# Patient Record
Sex: Female | Born: 1995 | Race: White | Hispanic: No | Marital: Single | Smoking: Former smoker
Health system: Southern US, Community
[De-identification: ages and names within clinical notes are randomized; demographics above are authoritative.]

## PROBLEM LIST (undated history)

## (undated) DIAGNOSIS — Z789 Other specified health status: Secondary | ICD-10-CM

## (undated) DIAGNOSIS — G43909 Migraine, unspecified, not intractable, without status migrainosus: Secondary | ICD-10-CM

## (undated) DIAGNOSIS — F509 Eating disorder, unspecified: Secondary | ICD-10-CM

## (undated) DIAGNOSIS — S82871A Displaced pilon fracture of right tibia, initial encounter for closed fracture: Secondary | ICD-10-CM

## (undated) DIAGNOSIS — N926 Irregular menstruation, unspecified: Secondary | ICD-10-CM

## (undated) DIAGNOSIS — S82891A Other fracture of right lower leg, initial encounter for closed fracture: Secondary | ICD-10-CM

## (undated) DIAGNOSIS — Z8489 Family history of other specified conditions: Secondary | ICD-10-CM

## (undated) DIAGNOSIS — R630 Anorexia: Secondary | ICD-10-CM

## (undated) HISTORY — DX: Eating disorder, unspecified: F50.9

---
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
## 2013-01-14 ENCOUNTER — Ambulatory Visit (HOSPITAL_COMMUNITY): Payer: Self-pay | Admitting: Psychiatry

## 2013-02-21 ENCOUNTER — Ambulatory Visit (HOSPITAL_COMMUNITY): Payer: Self-pay | Admitting: Psychiatry

## 2014-04-21 ENCOUNTER — Encounter (HOSPITAL_COMMUNITY): Payer: Self-pay | Admitting: Emergency Medicine

## 2014-04-21 ENCOUNTER — Emergency Department (HOSPITAL_COMMUNITY)
Admission: EM | Admit: 2014-04-21 | Discharge: 2014-04-21 | Disposition: A | Discharge status: Home / Self Care | Payer: Self-pay | Attending: Emergency Medicine | Admitting: Emergency Medicine

## 2014-04-21 ENCOUNTER — Emergency Department (HOSPITAL_COMMUNITY): Payer: Self-pay

## 2014-04-21 DIAGNOSIS — Y92009 Unspecified place in unspecified non-institutional (private) residence as the place of occurrence of the external cause: Secondary | ICD-10-CM | POA: Insufficient documentation

## 2014-04-21 DIAGNOSIS — R52 Pain, unspecified: Secondary | ICD-10-CM

## 2014-04-21 DIAGNOSIS — S82891A Other fracture of right lower leg, initial encounter for closed fracture: Secondary | ICD-10-CM

## 2014-04-21 DIAGNOSIS — X58XXXA Exposure to other specified factors, initial encounter: Secondary | ICD-10-CM | POA: Insufficient documentation

## 2014-04-21 DIAGNOSIS — Y998 Other external cause status: Secondary | ICD-10-CM | POA: Insufficient documentation

## 2014-04-21 DIAGNOSIS — Y9339 Activity, other involving climbing, rappelling and jumping off: Secondary | ICD-10-CM | POA: Insufficient documentation

## 2014-04-21 DIAGNOSIS — F909 Attention-deficit hyperactivity disorder, unspecified type: Secondary | ICD-10-CM | POA: Insufficient documentation

## 2014-04-21 DIAGNOSIS — Z8701 Personal history of pneumonia (recurrent): Secondary | ICD-10-CM | POA: Insufficient documentation

## 2014-04-21 DIAGNOSIS — Z880 Allergy status to penicillin: Secondary | ICD-10-CM | POA: Insufficient documentation

## 2014-04-21 DIAGNOSIS — R609 Edema, unspecified: Secondary | ICD-10-CM

## 2014-04-21 DIAGNOSIS — S82841A Displaced bimalleolar fracture of right lower leg, initial encounter for closed fracture: Secondary | ICD-10-CM | POA: Insufficient documentation

## 2014-04-21 DIAGNOSIS — Z79899 Other long term (current) drug therapy: Secondary | ICD-10-CM | POA: Insufficient documentation

## 2014-04-21 HISTORY — DX: Other fracture of right lower leg, initial encounter for closed fracture: S82.891A

## 2014-04-21 MED ORDER — OXYCODONE-ACETAMINOPHEN 5-325 MG PO TABS
2.0000 | ORAL_TABLET | Freq: Once | ORAL | Status: AC
  Administered 2014-04-21: 2 via ORAL
  Filled 2014-04-21: qty 2

## 2014-04-21 MED ORDER — OXYCODONE-ACETAMINOPHEN 10-325 MG PO TABS: 1.0000 | ORAL_TABLET | ORAL | Status: DC | PRN

## 2014-04-21 NOTE — ED Notes (Signed)
Placed ice on right lower ankle

## 2014-04-21 NOTE — ED Notes (Signed)
Pt comfortable with discharge and follow up instructions. Pt declines wheelchair, escorted to waiting area by this RN. Prescription x1 

## 2014-04-21 NOTE — Discharge Instructions (Signed)
1. Medications: percocet, usual home medications 2. Treatment: rest, drink plenty of fluids, use crutches - no walking on your foot 3. Follow Up: Please followup with Dr. Dion SaucierLandau this week for further evaluation; Please return to the ER for worsening symptoms, SOB, chest pain or other concerning symptoms

## 2014-04-21 NOTE — Progress Notes (Signed)
Orthopedic Tech Progress Note Patient Details:  Leslie Villegas April 23, 1995 [id number]  Ortho Devices Type of Ortho Device: Ace wrap, Crutches, Post (short leg) splint, Stirrup splint Ortho Device/Splint Location: rle Ortho Device/Splint Interventions: Application   [name] 04/21/2014, 1:42 PM

## 2014-04-21 NOTE — ED Provider Notes (Signed)
CSN: [id number]     Arrival date & time 04/21/14  1059 History   First MD Initiated Contact with Patient 04/21/14 1103     Chief Complaint  Patient presents with  . Ankle Pain     (Consider location/radiation/quality/duration/timing/severity/associated sxs/prior Treatment) Patient is a 19 y.o. female presenting with ankle pain. The history is provided by the patient and medical records. No language interpreter was used.  Ankle Pain Associated symptoms: no back pain, no fever and no neck pain      Leslie Villegas is a 19 y.o. female  with a hx of ADHD, ODD presents to the Emergency Department complaining of acute, persistent right ankle pain with associated swelling after jumping from a first story window and landing on her right foot at 6 AM.  Patient reports she had to jump out of the window because she "wasn't where she was supposed to be."  She denies falling or hitting her head. Patient denies knee, hip, pelvic or low back pain. She reports that she has been unable to ambulate but has full range of motion of her right hip and knee. No pain in the left ankle or leg. No treatments prior to arrival. Nothing makes her symptoms better and attending to weight-bear makes her symptoms worse. She denies fever or chills, nausea or vomiting, headache, neck pain, back pain, chest pain, weakness, numbness, tingling.   Past Medical History  Diagnosis Date  . Mental disorder   . Pneumonia     twice in the past  . ADHD (attention deficit hyperactivity disorder)   . Anxiety   . Oppositional defiant disorder   . Eating disorder    Past Surgical History  Procedure Laterality Date  . Tonsillectomy     History reviewed. No pertinent family history. History  Substance Use Topics  . Smoking status: Never Smoker   . Smokeless tobacco: Never Used  . Alcohol Use: No   OB History    No data available     Review of Systems  Constitutional: Negative for fever and chills.  HENT: Negative for  dental problem, facial swelling and nosebleeds.   Eyes: Negative for visual disturbance.  Respiratory: Negative for cough, chest tightness, shortness of breath, wheezing and stridor.   Cardiovascular: Negative for chest pain.  Gastrointestinal: Negative for nausea, vomiting and abdominal pain.  Genitourinary: Negative for dysuria, hematuria and flank pain.  Musculoskeletal: Positive for arthralgias (right ankle ). Negative for back pain, joint swelling, gait problem, neck pain and neck stiffness.  Skin: Negative for rash and wound.  Neurological: Negative for syncope, weakness, light-headedness, numbness and headaches.  Hematological: Does not bruise/bleed easily.  Psychiatric/Behavioral: The patient is not nervous/anxious.   All other systems reviewed and are negative.     Allergies  Penicillins  Home Medications   Prior to Admission medications   Medication Sig Start Date End Date Taking? Authorizing Provider  etonogestrel (NEXPLANON) 68 MG IMPL implant 1 each by Subdermal route once.   Yes Historical Provider, MD  amphetamine-dextroamphetamine (ADDERALL XR) 20 MG 24 hr capsule Take 1 capsule (20 mg total) by mouth every morning. Patient not taking: Reported on 04/21/2014 12/11/12   [name], MD  oxyCODONE-acetaminophen (PERCOCET) 10-325 MG per tablet Take 1 tablet by mouth every 4 (four) hours as needed for pain. 04/21/14   Sirus Labrie, PA-C   BP 113/72 mmHg  Pulse 73  Temp(Src) 97.7 F (36.5 C) (Oral)  Resp 16  Ht  (1.651 m)  Wt 112 lb (  50.803 kg)  BMI 18.64 kg/m2  SpO2 99%  LMP 04/07/2014 Physical Exam  Constitutional: She is oriented to person, place, and time. She appears well-developed and well-nourished. No distress.  HENT:  Head: Normocephalic and atraumatic.  Nose: Nose normal.  Mouth/Throat: Uvula is midline, oropharynx is clear and moist and mucous membranes are normal.  Eyes: Conjunctivae and EOM are normal. Pupils are equal, round, and  reactive to light.  Neck: Normal range of motion. No spinous process tenderness and no muscular tenderness present. No rigidity. Normal range of motion present.  Full ROM without pain No midline cervical tenderness No paraspinal tenderness  Cardiovascular: Normal rate, regular rhythm, normal heart sounds and intact distal pulses.   No murmur heard. Pulses:      Radial pulses are 2+ on the right side, and 2+ on the left side.       Dorsalis pedis pulses are 2+ on the right side, and 2+ on the left side.       Posterior tibial pulses are 2+ on the right side, and 2+ on the left side.  Capillary refill < 3 sec  Pulmonary/Chest: Effort normal and breath sounds normal. No accessory muscle usage. No respiratory distress. She has no decreased breath sounds. She has no wheezes. She has no rhonchi. She has no rales. She exhibits no tenderness and no bony tenderness.  No contusion or ecchymosis No flail segment, crepitus or deformity Equal chest expansion  Abdominal: Soft. Normal appearance and bowel sounds are normal. There is no tenderness. There is no rigidity, no guarding and no CVA tenderness.  No contusion or ecchymosis  Abd soft and nontender  Musculoskeletal: Normal range of motion. She exhibits tenderness. She exhibits no edema.       Thoracic back: She exhibits normal range of motion.       Lumbar back: She exhibits normal range of motion.  Full range of motion of the T-spine and L-spine No tenderness to palpation of the spinous processes of the T-spine or L-spine No tenderness to palpation of the paraspinous muscles of the L-spine Pelvis stable Full range of motion of the right hip right knee and all toes on the right foot. No range of motion in the right ankle with associated deformity and swelling Palpable pulses in the right foot Capillary refill less than 3 seconds in the right foot  Lymphadenopathy:    She has no cervical adenopathy.  Neurological: She is alert and oriented to  person, place, and time. She has normal reflexes. No cranial nerve deficit. Coordination normal. GCS eye subscore is 4. GCS verbal subscore is 5. GCS motor subscore is 6.  Reflex Scores:      Bicep reflexes are 2+ on the right side and 2+ on the left side.      Brachioradialis reflexes are 2+ on the right side and 2+ on the left side.      Patellar reflexes are 2+ on the right side and 2+ on the left side.      Achilles reflexes are 2+ on the right side and 2+ on the left side. Speech is clear and goal oriented, follows commands Normal 5/5 strength in upper and lower extremities bilaterally excluding dorsiflexion and plantar flexion of the right ankle which is 0/5 Sensation normal to light and sharp touch Moves extremities without ataxia, coordination intact Unable to ambulate due to ankle pain No Clonus  Skin: Skin is warm and dry. No rash noted. She is not diaphoretic. No erythema.  No tenting of the skin  Psychiatric: She has a normal mood and affect.  Nursing note and vitals reviewed.   ED Course  Procedures (including critical care time) Labs Review Labs Reviewed - No data to display  Imaging Review Dg Ankle Complete Right  04/21/2014   CLINICAL DATA:  Leslie Villegas data window. Landed on the right ankle. Pain all over.  EXAM: RIGHT ANKLE - COMPLETE 3+ VIEW  COMPARISON:  None.  FINDINGS: Minimally displaced transverse lateral malleolar fracture with 2 mm of lateral displacement. There is a mildly comminuted medial malleolar fracture without significant displacement. The tibiotalar articulation is normal. There is mild lateral talofibular joint widening. There is no dislocation. There is no other fracture. There is severe soft tissue swelling overlying the lateral malleolus. There is mild soft tissue swelling over the medial malleolus.  IMPRESSION: 1. Bimalleolar right ankle fracture without dislocation.   Electronically Signed   By: Elige Ko   On: 04/21/2014 11:51     EKG  Interpretation None      MDM   Final diagnoses:  Pain  Swelling  Bimalleolar ankle fracture, right, closed, initial encounter   Leslie Villegas presents with right ankle pain and deformity, suspect fracture. Will image and give pain control.  12:24 PM X-ray with bimalleolar right ankle fracture without dislocation.  12:34 PM Discussed with Dr. Dion Saucier who requests splint and outpatient follow-up.  Pt pain controlled.  Pulses intact.  Will splint.  1:48 PM Pulses, movement and capillary refill intact pre and post splinting.    I have personally reviewed patient's vitals, nursing note and any pertinent labs or imaging.  I performed an undressed physical exam.    It has been determined that no acute conditions requiring further emergency intervention are present at this time. The patient/guardian have been advised of the diagnosis and plan. I reviewed all labs and imaging including any potential incidental findings. We have discussed signs and symptoms that warrant return to the ED and they are listed in the discharge instructions.    Vital signs are stable at discharge.   BP 113/72 mmHg  Pulse 73  Temp(Src) 97.7 F (36.5 C) (Oral)  Resp 16  Ht  (1.651 m)  Wt 112 lb (50.803 kg)  BMI 18.64 kg/m2  SpO2 99%  LMP 04/07/2014        Dierdre Forth, PA-C 04/21/14 1348  Elwin Mocha, MD 04/21/14 1515

## 2014-04-21 NOTE — ED Notes (Addendum)
Ortho at bedside to apply splint.

## 2014-04-21 NOTE — ED Notes (Signed)
Pt states she jumped out of her window in the house. She believes it was about 1 and a half stories high. She landed on her right ankle. Pt states she cannot put weight on this ankle.

## 2014-04-28 ENCOUNTER — Encounter (HOSPITAL_BASED_OUTPATIENT_CLINIC_OR_DEPARTMENT_OTHER): Payer: Self-pay | Admitting: *Deleted

## 2014-04-30 ENCOUNTER — Ambulatory Visit (HOSPITAL_BASED_OUTPATIENT_CLINIC_OR_DEPARTMENT_OTHER): Payer: Self-pay | Admitting: Certified Registered"

## 2014-04-30 ENCOUNTER — Encounter (HOSPITAL_BASED_OUTPATIENT_CLINIC_OR_DEPARTMENT_OTHER)
Admission: RE | Disposition: A | Discharge status: Home / Self Care | Payer: Self-pay | Source: Ambulatory Visit | Attending: Orthopedic Surgery

## 2014-04-30 ENCOUNTER — Ambulatory Visit (HOSPITAL_BASED_OUTPATIENT_CLINIC_OR_DEPARTMENT_OTHER)
Admission: RE | Admit: 2014-04-30 | Discharge: 2014-04-30 | Disposition: A | Discharge status: Home / Self Care | Payer: Self-pay | Source: Ambulatory Visit | Attending: Orthopedic Surgery | Admitting: Orthopedic Surgery

## 2014-04-30 ENCOUNTER — Encounter (HOSPITAL_BASED_OUTPATIENT_CLINIC_OR_DEPARTMENT_OTHER): Payer: Self-pay | Admitting: *Deleted

## 2014-04-30 DIAGNOSIS — Z791 Long term (current) use of non-steroidal anti-inflammatories (NSAID): Secondary | ICD-10-CM | POA: Insufficient documentation

## 2014-04-30 DIAGNOSIS — Z79899 Other long term (current) drug therapy: Secondary | ICD-10-CM | POA: Insufficient documentation

## 2014-04-30 DIAGNOSIS — Y929 Unspecified place or not applicable: Secondary | ICD-10-CM | POA: Insufficient documentation

## 2014-04-30 DIAGNOSIS — F17210 Nicotine dependence, cigarettes, uncomplicated: Secondary | ICD-10-CM | POA: Insufficient documentation

## 2014-04-30 DIAGNOSIS — X58XXXA Exposure to other specified factors, initial encounter: Secondary | ICD-10-CM | POA: Insufficient documentation

## 2014-04-30 DIAGNOSIS — G43909 Migraine, unspecified, not intractable, without status migrainosus: Secondary | ICD-10-CM | POA: Insufficient documentation

## 2014-04-30 DIAGNOSIS — Y9339 Activity, other involving climbing, rappelling and jumping off: Secondary | ICD-10-CM | POA: Insufficient documentation

## 2014-04-30 DIAGNOSIS — Y999 Unspecified external cause status: Secondary | ICD-10-CM | POA: Insufficient documentation

## 2014-04-30 DIAGNOSIS — S82871A Displaced pilon fracture of right tibia, initial encounter for closed fracture: Secondary | ICD-10-CM

## 2014-04-30 HISTORY — DX: Anorexia: R63.0

## 2014-04-30 HISTORY — PX: ORIF ANKLE FRACTURE: SHX5408

## 2014-04-30 HISTORY — DX: Displaced pilon fracture of right tibia, initial encounter for closed fracture: S82.871A

## 2014-04-30 HISTORY — DX: Other specified health status: Z78.9

## 2014-04-30 HISTORY — DX: Migraine, unspecified, not intractable, without status migrainosus: G43.909

## 2014-04-30 HISTORY — DX: Irregular menstruation, unspecified: N92.6

## 2014-04-30 HISTORY — DX: Family history of other specified conditions: Z84.89

## 2014-04-30 HISTORY — DX: Other fracture of right lower leg, initial encounter for closed fracture: S82.891A

## 2014-04-30 SURGERY — OPEN REDUCTION INTERNAL FIXATION (ORIF) ANKLE FRACTURE
Anesthesia: Regional | Site: Ankle | Laterality: Right

## 2014-04-30 MED ORDER — MEPERIDINE HCL 25 MG/ML IJ SOLN: 6.2500 mg | INTRAMUSCULAR | Status: DC | PRN

## 2014-04-30 MED ORDER — BACLOFEN 10 MG PO TABS: 10.0000 mg | ORAL_TABLET | Freq: Three times a day (TID) | ORAL | Status: DC

## 2014-04-30 MED ORDER — MIDAZOLAM HCL 2 MG/2ML IJ SOLN
INTRAMUSCULAR | Status: AC
  Filled 2014-04-30: qty 2

## 2014-04-30 MED ORDER — FENTANYL CITRATE 0.05 MG/ML IJ SOLN
50.0000 ug | INTRAMUSCULAR | Status: DC | PRN
  Administered 2014-04-30 (×2): 50 ug via INTRAVENOUS

## 2014-04-30 MED ORDER — HYDROMORPHONE HCL 1 MG/ML IJ SOLN
INTRAMUSCULAR | Status: AC
  Filled 2014-04-30: qty 1

## 2014-04-30 MED ORDER — SENNA-DOCUSATE SODIUM 8.6-50 MG PO TABS: 2.0000 | ORAL_TABLET | Freq: Every day | ORAL | Status: DC

## 2014-04-30 MED ORDER — LIDOCAINE HCL (CARDIAC) 20 MG/ML IV SOLN
INTRAVENOUS | Status: DC | PRN
  Administered 2014-04-30: 60 mg via INTRAVENOUS

## 2014-04-30 MED ORDER — OXYCODONE-ACETAMINOPHEN 10-325 MG PO TABS: 1.0000 | ORAL_TABLET | Freq: Four times a day (QID) | ORAL | Status: DC | PRN

## 2014-04-30 MED ORDER — OXYCODONE-ACETAMINOPHEN 5-325 MG PO TABS: 1.0000 | ORAL_TABLET | Freq: Four times a day (QID) | ORAL | Status: DC | PRN

## 2014-04-30 MED ORDER — MIDAZOLAM HCL 2 MG/ML PO SYRP: 12.0000 mg | ORAL_SOLUTION | Freq: Once | ORAL | Status: DC | PRN

## 2014-04-30 MED ORDER — CEFAZOLIN SODIUM-DEXTROSE 2-3 GM-% IV SOLR
INTRAVENOUS | Status: AC
  Filled 2014-04-30: qty 50

## 2014-04-30 MED ORDER — ONDANSETRON HCL 4 MG/2ML IJ SOLN
INTRAMUSCULAR | Status: DC | PRN
  Administered 2014-04-30: 4 mg via INTRAVENOUS

## 2014-04-30 MED ORDER — MIDAZOLAM HCL 2 MG/2ML IJ SOLN
1.0000 mg | INTRAMUSCULAR | Status: DC | PRN
  Administered 2014-04-30: 2 mg via INTRAVENOUS

## 2014-04-30 MED ORDER — FENTANYL CITRATE 0.05 MG/ML IJ SOLN
INTRAMUSCULAR | Status: AC
  Filled 2014-04-30: qty 6

## 2014-04-30 MED ORDER — HYDROMORPHONE HCL 1 MG/ML IJ SOLN
0.2500 mg | INTRAMUSCULAR | Status: DC | PRN
  Administered 2014-04-30: 0.25 mg via INTRAVENOUS

## 2014-04-30 MED ORDER — PROPOFOL 10 MG/ML IV BOLUS
INTRAVENOUS | Status: DC | PRN
  Administered 2014-04-30: 130 mg via INTRAVENOUS

## 2014-04-30 MED ORDER — PROMETHAZINE HCL 25 MG/ML IJ SOLN: 6.2500 mg | INTRAMUSCULAR | Status: DC | PRN

## 2014-04-30 MED ORDER — CEFAZOLIN SODIUM-DEXTROSE 2-3 GM-% IV SOLR
2.0000 g | INTRAVENOUS | Status: AC
  Administered 2014-04-30: 2 g via INTRAVENOUS

## 2014-04-30 MED ORDER — FENTANYL CITRATE 0.05 MG/ML IJ SOLN
INTRAMUSCULAR | Status: AC
  Filled 2014-04-30: qty 2

## 2014-04-30 MED ORDER — ONDANSETRON HCL 4 MG PO TABS: 4.0000 mg | ORAL_TABLET | Freq: Three times a day (TID) | ORAL | Status: DC | PRN

## 2014-04-30 MED ORDER — LACTATED RINGERS IV SOLN
INTRAVENOUS | Status: DC
  Administered 2014-04-30 (×2): via INTRAVENOUS

## 2014-04-30 MED ORDER — DEXAMETHASONE SODIUM PHOSPHATE 4 MG/ML IJ SOLN
INTRAMUSCULAR | Status: DC | PRN
  Administered 2014-04-30: 10 mg via INTRAVENOUS

## 2014-04-30 SURGICAL SUPPLY — 80 items
BANDAGE ELASTIC 4 VELCRO ST LF (GAUZE/BANDAGES/DRESSINGS) ×3 IMPLANT
BANDAGE ELASTIC 6 VELCRO ST LF (GAUZE/BANDAGES/DRESSINGS) ×3 IMPLANT
BANDAGE ESMARK 6X9 LF (GAUZE/BANDAGES/DRESSINGS) ×1 IMPLANT
BIT DRILL 2.5X110 QC LCP DISP (BIT) ×3 IMPLANT
BIT DRILL CANN 2.7X625 NONSTRL (BIT) ×3 IMPLANT
BLADE SURG 15 STRL LF DISP TIS (BLADE) ×2 IMPLANT
BLADE SURG 15 STRL SS (BLADE) ×4
BNDG COHESIVE 4X5 TAN STRL (GAUZE/BANDAGES/DRESSINGS) ×3 IMPLANT
BNDG ESMARK 6X9 LF (GAUZE/BANDAGES/DRESSINGS) ×3
CANISTER SUCT 1200ML W/VALVE (MISCELLANEOUS) ×3 IMPLANT
CLOSURE STERI-STRIP 1/2X4 (GAUZE/BANDAGES/DRESSINGS)
CLSR STERI-STRIP ANTIMIC 1/2X4 (GAUZE/BANDAGES/DRESSINGS) IMPLANT
COVER BACK TABLE 60X90IN (DRAPES) ×3 IMPLANT
CUFF TOURNIQUET SINGLE 34IN LL (TOURNIQUET CUFF) ×3 IMPLANT
DECANTER SPIKE VIAL GLASS SM (MISCELLANEOUS) IMPLANT
DRAPE C-ARM 42X72 X-RAY (DRAPES) IMPLANT
DRAPE C-ARMOR (DRAPES) IMPLANT
DRAPE EXTREMITY T 121X128X90 (DRAPE) ×3 IMPLANT
DRAPE INCISE IOBAN 66X45 STRL (DRAPES) IMPLANT
DRAPE OEC MINIVIEW 54X84 (DRAPES) ×3 IMPLANT
DRAPE U 20/CS (DRAPES) ×3 IMPLANT
DRAPE U-SHAPE 47X51 STRL (DRAPES) ×3 IMPLANT
DRSG PAD ABDOMINAL 8X10 ST (GAUZE/BANDAGES/DRESSINGS) ×3 IMPLANT
DURAPREP 26ML APPLICATOR (WOUND CARE) ×3 IMPLANT
ELECT REM PT RETURN 9FT ADLT (ELECTROSURGICAL) ×3
ELECTRODE REM PT RTRN 9FT ADLT (ELECTROSURGICAL) ×1 IMPLANT
GAUZE SPONGE 4X4 12PLY STRL (GAUZE/BANDAGES/DRESSINGS) ×3 IMPLANT
GLOVE BIO SURGEON STRL SZ8 (GLOVE) ×3 IMPLANT
GLOVE BIOGEL PI IND STRL 7.0 (GLOVE) ×2 IMPLANT
GLOVE BIOGEL PI IND STRL 8 (GLOVE) ×2 IMPLANT
GLOVE BIOGEL PI INDICATOR 7.0 (GLOVE) ×4
GLOVE BIOGEL PI INDICATOR 8 (GLOVE) ×4
GLOVE ECLIPSE 6.5 STRL STRAW (GLOVE) ×3 IMPLANT
GLOVE ORTHO TXT STRL SZ7.5 (GLOVE) ×3 IMPLANT
GOWN STRL REUS W/ TWL LRG LVL3 (GOWN DISPOSABLE) ×1 IMPLANT
GOWN STRL REUS W/ TWL XL LVL3 (GOWN DISPOSABLE) ×2 IMPLANT
GOWN STRL REUS W/TWL LRG LVL3 (GOWN DISPOSABLE) ×2
GOWN STRL REUS W/TWL XL LVL3 (GOWN DISPOSABLE) ×4
KIT 1/3 TUB PL 3H 37M (Orthopedic Implant) ×1 IMPLANT
KIT 1/3 TUB PL 5H 61M (Orthopedic Implant) ×1 IMPLANT
NEEDLE HYPO 25X1 1.5 SAFETY (NEEDLE) IMPLANT
NS IRRIG 1000ML POUR BTL (IV SOLUTION) ×3 IMPLANT
PACK BASIN DAY SURGERY FS (CUSTOM PROCEDURE TRAY) ×3 IMPLANT
PAD CAST 4YDX4 CTTN HI CHSV (CAST SUPPLIES) ×2 IMPLANT
PADDING CAST COTTON 4X4 STRL (CAST SUPPLIES) ×4
PENCIL BUTTON HOLSTER BLD 10FT (ELECTRODE) ×3 IMPLANT
PROS 1/3 TUB PL 3H 37M (Orthopedic Implant) ×3 IMPLANT
PROS 1/3 TUB PL 5H 61M (Orthopedic Implant) ×3 IMPLANT
SCREW CANC FT 4.0X35 (Screw) ×3 IMPLANT
SCREW CANC FT ST SFS 4X14 (Screw) ×3 IMPLANT
SCREW CANC FT ST SFS 4X16 (Screw) ×3 IMPLANT
SCREW CANN S THRD/50 4.0 (Screw) ×3 IMPLANT
SCREW CORTEX 3.5 12MM (Screw) ×2 IMPLANT
SCREW CORTEX 3.5 14MM (Screw) ×4 IMPLANT
SCREW CORTEX 3.5 28MM (Screw) ×2 IMPLANT
SCREW LOCK CORT ST 3.5X12 (Screw) ×1 IMPLANT
SCREW LOCK CORT ST 3.5X14 (Screw) ×2 IMPLANT
SCREW LOCK CORT ST 3.5X28 (Screw) ×1 IMPLANT
SHEET MEDIUM DRAPE 40X70 STRL (DRAPES) IMPLANT
SLEEVE SCD COMPRESS KNEE MED (MISCELLANEOUS) IMPLANT
SPLINT FAST PLASTER 5X30 (CAST SUPPLIES)
SPLINT PLASTER CAST FAST 5X30 (CAST SUPPLIES) IMPLANT
SPONGE LAP 4X18 X RAY DECT (DISPOSABLE) ×3 IMPLANT
STAPLER VISISTAT 35W (STAPLE) IMPLANT
SUCTION FRAZIER TIP 10 FR DISP (SUCTIONS) ×3 IMPLANT
SUT ETHILON 3 0 PS 1 (SUTURE) IMPLANT
SUT ETHILON 4 0 PS 2 18 (SUTURE) IMPLANT
SUT MNCRL AB 4-0 PS2 18 (SUTURE) IMPLANT
SUT VIC AB 0 CT1 27 (SUTURE)
SUT VIC AB 0 CT1 27XBRD ANBCTR (SUTURE) IMPLANT
SUT VIC AB 2-0 SH 18 (SUTURE) IMPLANT
SUT VIC AB 3-0 SH 27 (SUTURE) ×2
SUT VIC AB 3-0 SH 27X BRD (SUTURE) ×1 IMPLANT
SUT VICRYL 3-0 CR8 SH (SUTURE) ×3 IMPLANT
SYR BULB 3OZ (MISCELLANEOUS) ×3 IMPLANT
SYR CONTROL 10ML LL (SYRINGE) IMPLANT
TUBE CONNECTING 20'X1/4 (TUBING) ×1
TUBE CONNECTING 20X1/4 (TUBING) ×2 IMPLANT
UNDERPAD 30X30 INCONTINENT (UNDERPADS AND DIAPERS) ×3 IMPLANT
YANKAUER SUCT BULB TIP NO VENT (SUCTIONS) ×3 IMPLANT

## 2014-04-30 NOTE — Op Note (Signed)
04/30/2014  PATIENT:  Leslie Villegas    PRE-OPERATIVE DIAGNOSIS:  Right pilon fracture  POST-OPERATIVE DIAGNOSIS:  Same  PROCEDURE:  OPEN REDUCTION INTERNAL FIXATION (ORIF) RIGHT PILON FRACTURE WITH INTERNAL FIXATION OF MEDIAL AND LATERAL MALLEOLUS  SURGEON:  Eulas PostLANDAU,Alphonza Tramell P, MD  PHYSICIAN ASSISTANT: Janace LittenBrandon Parry, OPA-C, present and scrubbed throughout the case, critical for completion in a timely fashion, and for retraction, instrumentation, and closure.  ANESTHESIA:   General  PREOPERATIVE INDICATIONS:  Leslie Villegas is a  19 y.o. female who jumped off of a roof trying to sneak out from home and had a right pilon fracture. She elected for surgical management.  The risks benefits and alternatives were discussed with the patient preoperatively including but not limited to the risks of infection, bleeding, nerve injury, cardiopulmonary complications, the need for revision surgery, the need for hardware removal, among others, and the patient was willing to proceed.  OPERATIVE IMPLANTS: 1/3 tubular plate, size 5 hole for the lateral side, size 3 hole for the medial side, with a single 4.0 mm cannulated screw for the medial malleolus.  OPERATIVE PROCEDURE: The patient was brought to the operating room and placed in the supine position. All bony prominences were padded. General anesthesia was administered. The lower extremity was prepped and draped in the usual sterile fashion. The leg was elevated and exsanguinated and the tourniquet was inflated. Time out was performed. Of note, there was an area over the heel that appeared to have some degree of pressure from the splint, although it was not full-thickness skin damage, but certainly some degree of pressure damage to the skin.  Incision was made over the distal fibula and the fracture was exposed and reduced anatomically, and this is an extremely low fracture pattern, and in fact I could barely hold it with a clamp. It was not amenable to a  lag screw.  I then applied a 1/3 tubular locking plate and secured it proximally with cortical screws and distally with 2 non-locking cancellus screws. Bone quality was good. I used c-arm to confirm satisfactory reduction and fixation. The lateral fracture was however fairly comminuted, and did have interposed periosteum.  I then turned my attention to the medial malleolus. Incision was made over the medial malleolus and the fracture exposed. This is extremely comminuted, and in a vertical shear type pattern. I mobilized the fracture pattern, exposed and removed all interposed tissue and cortical debris, as there was a fair amount of comminution.  I reduced the fracture is anatomically as possible, restoring length and position of the medial fragment, and then placed a single cannulated guidewire to provisionally hold the fixation.  I then selected a one third tubular anti-glide plate, in order to counteract the vertical shear pattern, secured it proximally with a bicortical screw, and then distally across the fracture site with a cancellus screw. The length of the medial malleolus as well as his overall alignment was restored. She had some articular comminution, which was not amenable to fixation over the anteromedial corner. All loose debris was removed.  I then placed a 50 mm cannulated screw, short threads, which provided an augmentation for fixation of the medial malleolus.  The syndesmosis was stressed using live fluoroscopy and found to be stable. I performed both the external rotation dorsiflexion test, as well as distraction of the syndesmosis under live fluoroscopy using a clamp, and it appeared stable. Given that the fibular fracture pattern exited below the syndesmosis, I felt that the syndesmosis was intact based on the  combination of the intraoperative findings and the fracture pattern of a vertical shear transverse distal fibula.  The wounds were irrigated, and closed with vicryl with  routine closure for the skin. The wounds were injected with local anesthetic. Sterile gauze was applied followed by a posterior splint. She was awakened and returned to the PACU in stable and satisfactory condition. There were no complications.

## 2014-04-30 NOTE — Progress Notes (Signed)
Assisted Dr. Germeroth with right, ultrasound guided, popliteal/saphenous block. Side rails up, monitors on throughout procedure. See vital signs in flow sheet. Tolerated Procedure well. 

## 2014-04-30 NOTE — Transfer of Care (Signed)
Immediate Anesthesia Transfer of Care Note  Patient: Leslie Villegas  Procedure(s) Performed: Procedure(s): OPEN REDUCTION INTERNAL FIXATION (ORIF) RIGHT BIMALLEOLAR ANKLE FRACTURE (Right)  Patient Location: PACU  Anesthesia Type:GA combined with regional for post-op pain  Level of Consciousness: sedated  Airway & Oxygen Therapy: Patient Spontanous Breathing and Patient connected to face mask oxygen  Post-op Assessment: Report given to RN and Post -op Vital signs reviewed and stable  Post vital signs: Reviewed and stable  Last Vitals:  Filed Vitals:   04/30/14 1422  BP:   Pulse: 105  Temp:   Resp: 11    Complications: No apparent anesthesia complications

## 2014-04-30 NOTE — H&P (Signed)
PREOPERATIVE H&P  Chief Complaint: RIGHT ANKLE FRACTURE   HPI: Leslie Villegas is a 19 y.o. female who presents for preoperative history and physical with a diagnosis of RIGHT ANKLE FRACTURE . Symptoms are rated as moderate to severe, and have been worsening.  This is significantly impairing activities of daily living.  She has elected for surgical management. This happened after she jumped off a room about a week ago.  She was sneaking out of her house.    Past Medical History  Diagnosis Date  . Eating disorder     anorexia, per mother  . Migraines   . Vegetarian diet   . Poor appetite   . Ankle fracture, right 04/21/2014    jumped from a window  . Irregular periods   . Family history of adverse reaction to anesthesia     mother has hx. of post-op nausea, hypotension and hypothermia post-op   Past Surgical History  Procedure Laterality Date  . Tonsillectomy  04/14/2000   History   Social History  . Marital Status: Single    Spouse Name: N/A  . Number of Children: N/A  . Years of Education: N/A   Social History Main Topics  . Smoking status: Current Every Day Smoker -- 2 years    Types: Cigarettes  . Smokeless tobacco: Never Used     Comment: 2-3 cig./day  . Alcohol Use: Yes     Comment: occasionally  . Drug Use: No  . Sexual Activity: No   Other Topics Concern  . None   Social History Narrative   Family History  Problem Relation Age of Onset  . Anesthesia problems Mother     post-op nausea/hypotension/hypothermia   Allergies  Allergen Reactions  . Penicillins Hives and Shortness Of Breath   Prior to Admission medications   Medication Sig Start Date End Date Taking? Authorizing Provider  naproxen sodium (ANAPROX) 220 MG tablet Take 220 mg by mouth 2 (two) times daily with a meal.   Yes Historical Provider, MD  traMADol (ULTRAM) 50 MG tablet Take 50 mg by mouth every 6 (six) hours as needed.   Yes Historical Provider, MD  etonogestrel (NEXPLANON) 68 MG IMPL  implant 1 each by Subdermal route once.    Historical Provider, MD     Positive ROS: All other systems have been reviewed and were otherwise negative with the exception of those mentioned in the HPI and as above.  Physical Exam: General: Alert, no acute distress Cardiovascular: No pedal edema Respiratory: No cyanosis, no use of accessory musculature GI: No organomegaly, abdomen is soft and non-tender Skin: No lesions in the area of chief complaint Neurologic: Sensation intact distally Psychiatric: Patient is competent for consent with normal mood and affect Lymphatic: No axillary or cervical lymphadenopathy  MUSCULOSKELETAL: right ankle with swelling and ecchymosis and pain to palpation.  No back pain.  Assessment: RIGHT ANKLE FRACTURE   Plan: Plan for Procedure(s): OPEN REDUCTION INTERNAL FIXATION (ORIF) RIGHT BIMALLEOLAR ANKLE FRACTURE  The risks benefits and alternatives were discussed with the patient including but not limited to the risks of nonoperative treatment, versus surgical intervention including infection, bleeding, nerve injury, malunion, nonunion, the need for revision surgery, hardware prominence, hardware failure, the need for hardware removal, blood clots, cardiopulmonary complications, morbidity, mortality, among others, and they were willing to proceed.     Eulas PostLANDAU,Javis Abboud P, MD Cell [phone]   04/30/2014 12:13 PM

## 2014-04-30 NOTE — Anesthesia Preprocedure Evaluation (Addendum)
Anesthesia Evaluation  Patient identified by MRN, date of birth, ID band Patient awake    Reviewed: Allergy & Precautions, NPO status , Patient's Chart, lab work & pertinent test results  Airway Mallampati: II  TM Distance: >3 FB Neck ROM: Full    Dental no notable dental hx.    Pulmonary neg pulmonary ROS, Current Smoker,  breath sounds clear to auscultation  Pulmonary exam normal       Cardiovascular negative cardio ROS  Rhythm:Regular Rate:Normal     Neuro/Psych  Headaches, negative psych ROS   GI/Hepatic negative GI ROS, Neg liver ROS,   Endo/Other  negative endocrine ROS  Renal/GU negative Renal ROS  negative genitourinary   Musculoskeletal negative musculoskeletal ROS (+)   Abdominal   Peds  Hematology negative hematology ROS (+)   Anesthesia Other Findings   Reproductive/Obstetrics negative OB ROS                            Anesthesia Physical Anesthesia Plan  ASA: I  Anesthesia Plan: General and Regional   Post-op Pain Management:    Induction: Intravenous  Airway Management Planned: LMA  Additional Equipment: None  Intra-op Plan:   Post-operative Plan: Extubation in OR  Informed Consent: I have reviewed the patients History and Physical, chart, labs and discussed the procedure including the risks, benefits and alternatives for the proposed anesthesia with the patient or authorized representative who has indicated his/her understanding and acceptance.   Dental advisory given  Plan Discussed with: CRNA  Anesthesia Plan Comments:         Anesthesia Quick Evaluation

## 2014-04-30 NOTE — Anesthesia Procedure Notes (Addendum)
Anesthesia Regional Block:  Adductor canal block  Pre-Anesthetic Checklist: ,, timeout performed, Correct Patient, Correct Site, Correct Laterality, Correct Procedure, Correct Position, site marked, Risks and benefits discussed, Surgical consent,  Pre-op evaluation,  Post-op pain management  Laterality: Right  Prep: chloraprep       Needles:  Injection technique: Single-shot  Needle Type: Stimulator Needle - 80     Needle Length: 9cm 9 cm Needle Gauge: 22 and 22 G  Needle insertion depth: 6 cm   Additional Needles:  Procedures: ultrasound guided (picture in chart) Adductor canal block Narrative:  Injection made incrementally with aspirations every 5 mL.  Performed by: Personally  Anesthesiologist: Lewie LoronGERMEROTH, JOHN R  Additional Notes: Good perineural spread   Anesthesia Regional Block:  Popliteal block  Pre-Anesthetic Checklist: ,, timeout performed, Correct Patient, Correct Site, Correct Laterality, Correct Procedure, Correct Position, site marked, Risks and benefits discussed, Surgical consent,  Pre-op evaluation,  Post-op pain management  Laterality: Right  Prep: chloraprep       Needles:  Injection technique: Single-shot  Needle Type: Stimiplex     Needle Length: 10cm 10 cm Needle Gauge: 21 and 21 G    Additional Needles:  Procedures: ultrasound guided (picture in chart) and nerve stimulator  Motor weakness within 5 minutes. Popliteal block  Nerve Stimulator or Paresthesia:  Response: Eversion, 0.5 mA,   Additional Responses:   Narrative:  Injection made incrementally with aspirations every 5 mL.  Performed by: Personally  Anesthesiologist: Lewie LoronGERMEROTH, JOHN R  Additional Notes: Good perineural spread   Procedure Name: LMA Insertion Date/Time: 04/30/2014 12:23 PM Performed by: Gar GibbonKEETON, Nate Perri S Pre-anesthesia Checklist: Patient identified, Emergency Drugs available, Suction available and Patient being monitored Patient Re-evaluated:Patient  Re-evaluated prior to inductionOxygen Delivery Method: Circle System Utilized Preoxygenation: Pre-oxygenation with 100% oxygen Intubation Type: IV induction Ventilation: Mask ventilation without difficulty LMA: LMA inserted LMA Size: 3.0 Number of attempts: 1 Airway Equipment and Method: Bite block Placement Confirmation: positive ETCO2 Tube secured with: Tape Dental Injury: Teeth and Oropharynx as per pre-operative assessment

## 2014-04-30 NOTE — Anesthesia Postprocedure Evaluation (Signed)
Anesthesia Post Note  Patient: Leslie ChanceSuzanne Schumacher  Procedure(s) Performed: Procedure(s) (LRB): OPEN REDUCTION INTERNAL FIXATION (ORIF) RIGHT BIMALLEOLAR ANKLE FRACTURE (Right)  Anesthesia type: General  Patient location: PACU  Post pain: Pain level controlled  Post assessment: Post-op Vital signs reviewed  Last Vitals: BP 126/90 mmHg  Pulse 91  Temp(Src) 36.6 C (Oral)  Resp 18  Ht 5\' 5"  (1.651 m)  Wt 118 lb (53.524 kg)  BMI 19.64 kg/m2  SpO2 99%  LMP 04/07/2014  Post vital signs: Reviewed  Level of consciousness: sedated  Complications: No apparent anesthesia complications

## 2014-04-30 NOTE — Discharge Instructions (Signed)
Diet: As you were doing prior to hospitalization   Shower:  May shower but keep the wounds dry, use an occlusive plastic wrap, NO SOAKING IN TUB.  If the bandage gets wet, change with a clean dry gauze.   Activity:  Increase activity slowly as tolerated, but follow the weight bearing instructions below.  No lifting or driving for 6 weeks.  Weight Bearing:   No weight on right leg.    To prevent constipation: you may use a stool softener such as -  Colace (over the counter) 100 mg by mouth twice a day  Drink plenty of fluids (prune juice may be helpful) and high fiber foods Miralax (over the counter) for constipation as needed.    Itching:  If you experience itching with your medications, try taking only a single pain pill, or even half a pain pill at a time.  You may take up to 10 pain pills per day, and you can also use benadryl over the counter for itching or also to help with sleep.   Precautions:  If you experience chest pain or shortness of breath - call 911 immediately for transfer to the hospital emergency department!!  If you develop a fever greater that 101 F, purulent drainage from wound, increased redness or drainage from wound, or calf pain -- Call the office at [phone]                                                Follow- Up Appointment:  Please call for an appointment to be seen in 2 weeks [place] - [phone]  Regional Anesthesia Blocks  1. Numbness or the inability to move the "blocked" extremity may last from 3-48 hours after placement. The length of time depends on the medication injected and your individual response to the medication. If the numbness is not going away after 48 hours, call your surgeon.  2. The extremity that is blocked will need to be protected until the numbness is gone and the  Strength has returned. Because you cannot feel it, you will need to take extra care to avoid injury. Because it may be weak, you may have difficulty moving it or  using it. You may not know what position it is in without looking at it while the block is in effect.  3. For blocks in the legs and feet, returning to weight bearing and walking needs to be done carefully. You will need to wait until the numbness is entirely gone and the strength has returned. You should be able to move your leg and foot normally before you try and bear weight or walk. You will need someone to be with you when you first try to ensure you do not fall and possibly risk injury.  4. Bruising and tenderness at the needle site are common side effects and will resolve in a few days.  5. Persistent numbness or new problems with movement should be communicated to the surgeon or the [facility] [phone] [facility] [phone])  Post Anesthesia Home Care Instructions  Activity: Get plenty of rest for the remainder of the day. A responsible adult should stay with you for 24 hours following the procedure.  For the next 24 hours, DO NOT: -Drive a car -Advertising copywriter -Drink alcoholic beverages -Take any medication unless instructed by your physician -Make  any legal decisions or sign important papers.  Meals: Start with liquid foods such as gelatin or soup. Progress to regular foods as tolerated. Avoid greasy, spicy, heavy foods. If nausea and/or vomiting occur, drink only clear liquids until the nausea and/or vomiting subsides. Call your physician if vomiting continues.  Special Instructions/Symptoms: Your throat may feel dry or sore from the anesthesia or the breathing tube placed in your throat during surgery. If this causes discomfort, gargle with warm salt water. The discomfort should disappear within 24 hours.

## 2014-05-01 ENCOUNTER — Encounter (HOSPITAL_BASED_OUTPATIENT_CLINIC_OR_DEPARTMENT_OTHER): Payer: Self-pay | Admitting: Orthopedic Surgery

## 2014-05-05 LAB — POCT HEMOGLOBIN-HEMACUE: Hemoglobin: 13.8 g/dL (ref 12.0–15.0)

## 2015-11-28 ENCOUNTER — Emergency Department (HOSPITAL_COMMUNITY): Admission: EM | Admit: 2015-11-28 | Discharge: 2015-11-28 | Discharge status: Absent without leave | Payer: Self-pay

## 2015-11-28 ENCOUNTER — Ambulatory Visit (HOSPITAL_COMMUNITY)
Admission: RE | Admit: 2015-11-28 | Discharge: 2015-11-28 | Disposition: A | Discharge status: Home / Self Care | Payer: Managed Care, Other (non HMO) | Attending: Psychiatry | Admitting: Psychiatry

## 2015-11-28 DIAGNOSIS — F5000 Anorexia nervosa, unspecified: Secondary | ICD-10-CM | POA: Insufficient documentation

## 2015-11-28 DIAGNOSIS — Z811 Family history of alcohol abuse and dependence: Secondary | ICD-10-CM | POA: Diagnosis not present

## 2015-11-28 DIAGNOSIS — R45851 Suicidal ideations: Secondary | ICD-10-CM | POA: Diagnosis not present

## 2015-11-28 DIAGNOSIS — F1020 Alcohol dependence, uncomplicated: Secondary | ICD-10-CM | POA: Insufficient documentation

## 2015-11-28 DIAGNOSIS — Z818 Family history of other mental and behavioral disorders: Secondary | ICD-10-CM | POA: Diagnosis not present

## 2015-11-28 DIAGNOSIS — F902 Attention-deficit hyperactivity disorder, combined type: Secondary | ICD-10-CM | POA: Diagnosis not present

## 2015-11-28 DIAGNOSIS — F331 Major depressive disorder, recurrent, moderate: Secondary | ICD-10-CM | POA: Diagnosis not present
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
## 2015-11-29 ENCOUNTER — Encounter (HOSPITAL_COMMUNITY): Payer: Self-pay

## 2015-11-29 ENCOUNTER — Emergency Department (HOSPITAL_COMMUNITY)
Admission: EM | Admit: 2015-11-29 | Discharge: 2015-11-29 | Disposition: A | Discharge status: Home / Self Care | Payer: Managed Care, Other (non HMO) | Attending: Emergency Medicine | Admitting: Emergency Medicine

## 2015-11-29 DIAGNOSIS — F1010 Alcohol abuse, uncomplicated: Secondary | ICD-10-CM

## 2015-11-29 DIAGNOSIS — F419 Anxiety disorder, unspecified: Secondary | ICD-10-CM

## 2015-11-29 DIAGNOSIS — F17210 Nicotine dependence, cigarettes, uncomplicated: Secondary | ICD-10-CM | POA: Diagnosis not present

## 2015-11-29 DIAGNOSIS — Z5181 Encounter for therapeutic drug level monitoring: Secondary | ICD-10-CM | POA: Insufficient documentation

## 2015-11-29 DIAGNOSIS — F331 Major depressive disorder, recurrent, moderate: Secondary | ICD-10-CM | POA: Diagnosis not present

## 2015-11-29 DIAGNOSIS — F902 Attention-deficit hyperactivity disorder, combined type: Secondary | ICD-10-CM | POA: Insufficient documentation

## 2015-11-29 LAB — ACETAMINOPHEN LEVEL: Acetaminophen (Tylenol), Serum: <10 ug/mL — ABNORMAL LOW (ref 10–30)

## 2015-11-29 LAB — COMPREHENSIVE METABOLIC PANEL
ALT: 15 U/L (ref 14–54)
ANION GAP: 10 (ref 5–15)
AST: 23 U/L (ref 15–41)
Albumin: 4.5 g/dL (ref 3.5–5.0)
Alkaline Phosphatase: 61 U/L (ref 38–126)
BUN: 20 mg/dL (ref 6–20)
CALCIUM: 9.5 mg/dL (ref 8.9–10.3)
CHLORIDE: 100 mmol/L — AB (ref 101–111)
CO2: 24 mmol/L (ref 22–32)
CREATININE: 0.71 mg/dL (ref 0.44–1.00)
Glucose, Bld: 91 mg/dL (ref 65–99)
Potassium: 3.8 mmol/L (ref 3.5–5.1)
Sodium: 134 mmol/L — ABNORMAL LOW (ref 135–145)
Total Bilirubin: 0.3 mg/dL (ref 0.3–1.2)
Total Protein: 7.4 g/dL (ref 6.5–8.1)

## 2015-11-29 LAB — CBC
HCT: 39.9 % (ref 36.0–46.0)
Hemoglobin: 13.9 g/dL (ref 12.0–15.0)
MCH: 31 pg (ref 26.0–34.0)
MCHC: 34.8 g/dL (ref 30.0–36.0)
MCV: 88.9 fL (ref 78.0–100.0)
PLATELETS: 247 10*3/uL (ref 150–400)
RBC: 4.49 MIL/uL (ref 3.87–5.11)
RDW: 12.6 % (ref 11.5–15.5)
WBC: 7.8 10*3/uL (ref 4.0–10.5)

## 2015-11-29 LAB — RAPID URINE DRUG SCREEN, HOSP PERFORMED
Amphetamines: NOT DETECTED
Barbiturates: NOT DETECTED
Benzodiazepines: NOT DETECTED
Cocaine: NOT DETECTED
OPIATES: NOT DETECTED
Tetrahydrocannabinol: NOT DETECTED

## 2015-11-29 LAB — SALICYLATE LEVEL

## 2015-11-29 LAB — ETHANOL

## 2015-11-29 MED ORDER — THIAMINE HCL 100 MG/ML IJ SOLN: 100.0000 mg | Freq: Once | INTRAMUSCULAR | Status: DC

## 2015-11-29 MED ORDER — LORAZEPAM 1 MG PO TABS: 1.0000 mg | ORAL_TABLET | Freq: Every day | ORAL | Status: DC

## 2015-11-29 MED ORDER — HYDROXYZINE HCL 25 MG PO TABS
25.0000 mg | ORAL_TABLET | Freq: Four times a day (QID) | ORAL | Status: DC | PRN
  Filled 2015-11-29: qty 1

## 2015-11-29 MED ORDER — VITAMIN B-1 100 MG PO TABS: 100.0000 mg | ORAL_TABLET | Freq: Every day | ORAL | Status: DC

## 2015-11-29 MED ORDER — ADULT MULTIVITAMIN W/MINERALS CH: 1.0000 | ORAL_TABLET | Freq: Every day | ORAL | Status: DC

## 2015-11-29 MED ORDER — ONDANSETRON 4 MG PO TBDP: 4.0000 mg | ORAL_TABLET | Freq: Four times a day (QID) | ORAL | Status: DC | PRN

## 2015-11-29 MED ORDER — LOPERAMIDE HCL 2 MG PO CAPS: 2.0000 mg | ORAL_CAPSULE | ORAL | Status: DC | PRN

## 2015-11-29 MED ORDER — LORAZEPAM 1 MG PO TABS: 1.0000 mg | ORAL_TABLET | Freq: Two times a day (BID) | ORAL | Status: DC

## 2015-11-29 MED ORDER — LORAZEPAM 1 MG PO TABS: 1.0000 mg | ORAL_TABLET | Freq: Four times a day (QID) | ORAL | Status: DC | PRN

## 2015-11-29 MED ORDER — LORAZEPAM 1 MG PO TABS
1.0000 mg | ORAL_TABLET | Freq: Four times a day (QID) | ORAL | Status: DC
  Filled 2015-11-29: qty 1

## 2015-11-29 MED ORDER — HYDROXYZINE HCL 25 MG PO TABS: 25.0000 mg | ORAL_TABLET | Freq: Four times a day (QID) | ORAL | 0 refills | Status: DC | PRN

## 2015-11-29 MED ORDER — LORAZEPAM 1 MG PO TABS: 1.0000 mg | ORAL_TABLET | Freq: Three times a day (TID) | ORAL | Status: DC

## 2015-11-29 NOTE — Consult Note (Signed)
[place] [facility] Consult   Reason for Consult:  Depression and alcohol abuse Referring Physician:  EDP Patient Identification: Leslie Villegas MRN:  [id number] Principal Diagnosis: Major depressive disorder, recurrent episode, moderate ([place]) Diagnosis:   Patient Active Problem List   Diagnosis Date Noted  . Alcohol abuse [F10.10] 11/29/2015    Priority: High  . Major depressive disorder, recurrent episode, moderate ([place]) [F33.1] 11/29/2015    Priority: High  . Anxiety [F41.9] 11/29/2015    Priority: High  . Closed right pilon fracture [S82.871A] 04/30/2014  . ADHD (attention deficit hyperactivity disorder), combined type [F90.2] 01/11/2011  . ODD (oppositional defiant disorder) [F91.3] 01/11/2011  . EATING DISORDER, UNSPECIFIED [F50.9] 03/15/2010  . Anorexia nervosa [F50.00] 06/04/2009    Total Time spent with patient: 45 minutes  Subjective:   Leslie Villegas is a 20 y.o. female patient does not warrant admission.  HPI:  20 yo female who presented to the ED with suicidal ideation after drinking yesterday.  Today, she is clear and coherent with no suicidal ideations, denies past attempts.  No homicidal ideations, hallucinations, or withdrawal symptoms.  She has taken Wellbutrin and Ability "years ago" but nothing recently.  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  Grandfather maternal side was alcohol dependent.  Interested in Intensive Outpatient at [facility].  Past Psychiatric History: depression, anxiety, eating disorder, alcohol abuse  Risk to Self: Is patient at risk for suicide?: Yes Risk to Others:  none Prior Inpatient Therapy:  none Prior Outpatient Therapy:  "years ago"  Past Medical History:  Past Medical History:  Diagnosis Date  . Ankle fracture, right 04/21/2014   jumped from a window  . Closed right pilon fracture 04/30/2014  . Eating disorder     anorexia, per mother  . Family history of adverse reaction to anesthesia    mother has hx. of post-op nausea, hypotension and hypothermia post-op  . Irregular periods   . Migraines   . Poor appetite   . Vegetarian diet     Past Surgical History:  Procedure Laterality Date  . ORIF ANKLE FRACTURE Right 04/30/2014   Procedure: OPEN REDUCTION INTERNAL FIXATION (ORIF) RIGHT BIMALLEOLAR ANKLE FRACTURE;  Surgeon: Johnny Bridge, MD;  Location: [place];  Service: Orthopedics;  Laterality: Right;  . TONSILLECTOMY  04/14/2000   Family History:  Family History  Problem Relation Age of Onset  . Anesthesia problems Mother     post-op nausea/hypotension/hypothermia   Family Psychiatric  History: none Social History:  History  Alcohol Use  . Yes    Comment: occasionally     History  Drug Use No    Social History   Social History  . Marital status: Single    Spouse name: N/A  . Number of children: N/A  . Years of education: N/A   Social History Main Topics  . Smoking status: Current Every Day Smoker    Years: 2.00    Types: Cigarettes  . Smokeless tobacco: Never Used     Comment: 2-3 cig./day  . Alcohol use Yes     Comment: occasionally  . Drug use: No  . Sexual activity: No   Other Topics Concern  . None   Social History Narrative  . None   Additional Social History:    Allergies:   Allergies  Allergen Reactions  . Penicillins Hives and Shortness Of Breath  Has patient had a PCN reaction causing immediate rash, facial/tongue/throat swelling, SOB or lightheadedness with hypotension:Yes Has patient had a PCN reaction causing severe rash involving mucus membranes or skin necrosis:unsure Has patient had a PCN reaction that required hospitalization:unsure--but does not think so Has patient had a PCN reaction occurring within the last 10 years:No If all of the above answers are "NO", then may proceed with Cephalosporin use.     Labs:  Results for  orders placed or performed during the hospital encounter of 11/29/15 (from the past 48 hour(s))  Comprehensive metabolic panel     Status: Abnormal   Collection Time: 11/29/15 12:55 AM  Result Value Ref Range   Sodium 134 (L) 135 - 145 mmol/L   Potassium 3.8 3.5 - 5.1 mmol/L   Chloride 100 (L) 101 - 111 mmol/L   CO2 24 22 - 32 mmol/L   Glucose, Bld 91 65 - 99 mg/dL   BUN 20 6 - 20 mg/dL   Creatinine, Ser 0.71 0.44 - 1.00 mg/dL   Calcium 9.5 8.9 - 10.3 mg/dL   Total Protein 7.4 6.5 - 8.1 g/dL   Albumin 4.5 3.5 - 5.0 g/dL   AST 23 15 - 41 U/L   ALT 15 14 - 54 U/L   Alkaline Phosphatase 61 38 - 126 U/L   Total Bilirubin 0.3 0.3 - 1.2 mg/dL   GFR calc non Af Amer >60 >60 mL/min   GFR calc Af Amer >60 >60 mL/min    Comment: (NOTE) The eGFR has been calculated using the CKD EPI equation. This calculation has not been validated in all clinical situations. eGFR's persistently <60 mL/min signify possible Chronic Kidney Disease.    Anion gap 10 5 - 15  Ethanol     Status: None   Collection Time: 11/29/15 12:55 AM  Result Value Ref Range   Alcohol, Ethyl (B) <5 <5 mg/dL    Comment:        LOWEST DETECTABLE LIMIT FOR SERUM ALCOHOL IS 5 mg/dL FOR MEDICAL PURPOSES ONLY   Salicylate level     Status: None   Collection Time: 11/29/15 12:55 AM  Result Value Ref Range   Salicylate Lvl <8.1 2.8 - 30.0 mg/dL  Acetaminophen level     Status: Abnormal   Collection Time: 11/29/15 12:55 AM  Result Value Ref Range   Acetaminophen (Tylenol), Serum <10 (L) 10 - 30 ug/mL    Comment:        THERAPEUTIC CONCENTRATIONS VARY SIGNIFICANTLY. A RANGE OF 10-30 ug/mL MAY BE AN EFFECTIVE CONCENTRATION FOR MANY PATIENTS. HOWEVER, SOME ARE BEST TREATED AT CONCENTRATIONS OUTSIDE THIS RANGE. ACETAMINOPHEN CONCENTRATIONS >150 ug/mL AT 4 HOURS AFTER INGESTION AND >50 ug/mL AT 12 HOURS AFTER INGESTION ARE OFTEN ASSOCIATED WITH TOXIC REACTIONS.   cbc     Status: None   Collection Time: 11/29/15 12:55  AM  Result Value Ref Range   WBC 7.8 4.0 - 10.5 K/uL   RBC 4.49 3.87 - 5.11 MIL/uL   Hemoglobin 13.9 12.0 - 15.0 g/dL   HCT 39.9 36.0 - 46.0 %   MCV 88.9 78.0 - 100.0 fL   MCH 31.0 26.0 - 34.0 pg   MCHC 34.8 30.0 - 36.0 g/dL   RDW 12.6 11.5 - 15.5 %   Platelets 247 150 - 400 K/uL  Rapid urine drug screen (hospital performed)     Status: None   Collection Time: 11/29/15  2:14 AM  Result Value Ref Range   Opiates NONE DETECTED NONE DETECTED  Cocaine NONE DETECTED NONE DETECTED   Benzodiazepines NONE DETECTED NONE DETECTED   Amphetamines NONE DETECTED NONE DETECTED   Tetrahydrocannabinol NONE DETECTED NONE DETECTED   Barbiturates NONE DETECTED NONE DETECTED    Comment:        DRUG SCREEN FOR MEDICAL PURPOSES ONLY.  IF CONFIRMATION IS NEEDED FOR ANY PURPOSE, NOTIFY LAB WITHIN 5 DAYS.        LOWEST DETECTABLE LIMITS FOR URINE DRUG SCREEN Drug Class       Cutoff (ng/mL) Amphetamine      1000 Barbiturate      200 Benzodiazepine   161 Tricyclics       096 Opiates          300 Cocaine          300 THC              50     No current facility-administered medications for this encounter.    Current Outpatient Prescriptions  Medication Sig Dispense Refill  . etonogestrel (NEXPLANON) 68 MG IMPL implant 1 each by Subdermal route once.      Musculoskeletal: Strength & Muscle Tone: within normal limits Gait & Station: normal Patient leans: N/A  Psychiatric Specialty Exam: Physical Exam  Constitutional: She is oriented to person, place, and time. She appears well-developed and well-nourished.  HENT:  Head: Normocephalic.  Neck: Normal range of motion.  Respiratory: Effort normal.  Musculoskeletal: Normal range of motion.  Neurological: She is alert and oriented to person, place, and time.  Skin: Skin is warm and dry.  Psychiatric: Her speech is normal and behavior is normal. Judgment and thought content normal. Her mood appears anxious. Cognition and memory are normal. She  exhibits a depressed mood.    Review of Systems  Constitutional: Negative.   HENT: Negative.   Eyes: Negative.   Respiratory: Negative.   Cardiovascular: Negative.   Gastrointestinal: Negative.   Genitourinary: Negative.   Musculoskeletal: Negative.   Skin: Negative.   Neurological: Negative.   Endo/Heme/Allergies: Negative.   Psychiatric/Behavioral: Positive for depression and substance abuse. The patient is nervous/anxious.     Blood pressure 121/74, pulse 74, temperature 98.2 F (36.8 C), temperature source Oral, resp. rate 16, height '5\' 5"'  (1.651 m), SpO2 100 %.There is no height or weight on file to calculate BMI.  General Appearance: Casual  Eye Contact:  Good  Speech:  Normal Rate  Volume:  Normal  Mood:  Anxious and Depressed, mild  Affect:  Congruent  Thought Process:  Coherent and Descriptions of Associations: Intact  Orientation:  Full (Time, Place, and Person)  Thought Content:  WDL  Suicidal Thoughts:  No  Homicidal Thoughts:  No  Memory:  Immediate;   Good Recent;   Good Remote;   Good  Judgement:  Fair  Insight:  Fair  Psychomotor Activity:  Normal  Concentration:  Concentration: Good and Attention Span: Good  Recall:  Good  Fund of Knowledge:  Good  Language:  Good  Akathisia:  No  Handed:  Right  AIMS (if indicated):     Assets:  Housing Leisure Time Physical Health Resilience Social Support  ADL's:  Intact  Cognition:  WNL  Sleep:        Treatment Plan Summary: Daily contact with patient to assess and evaluate symptoms and progress in treatment, Medication management and Plan major depressive disorder, recurrent, moderate level:  -Crisis stabilization -Medication management:  Ativan alcohol detox protocol started -Individual and substance abuse counseling -IOP at [facility]  Disposition:  No evidence of imminent risk to self or others at present.    Waylan Boga, NP 11/29/2015 11:34 AM  Patient seen face-to-face for psychiatric evaluation,  chart reviewed and case discussed with the physician extender and developed treatment plan. Reviewed the information documented and agree with the treatment plan. Corena Pilgrim, MD

## 2015-11-29 NOTE — ED Notes (Addendum)
Written dc insrtuctions and prescription reviewed w/ pt.  Pt encouraged to take medications as directed and follow up w/ IOP as instructed.  Pt denies si/hi/avh on dc.  Pt encouraged to avoid alcohol, and seek treatment for return fo si./hi/avh.  Pt verbalized understanding.  Pt to take visteril if needed when she gets home.  Pt ambulatory w/o difficulty to dc area w/ mHt, belongings returned after leaving the area.

## 2015-11-29 NOTE — ED Notes (Signed)
On the phone 

## 2015-11-29 NOTE — ED Notes (Signed)
Patient noted sleeping in room. No complaints, stable, in no acute distress. Q15 minute rounds and monitoring via Security Cameras to continue.  

## 2015-11-29 NOTE — ED Notes (Signed)
Up to the bathroom 

## 2015-11-29 NOTE — ED Provider Notes (Signed)
[facility]-EMERGENCY DEPT Provider Note   CSN: [id number] Arrival date & time: 11/29/15  0028     History   Chief Complaint Chief Complaint  Patient presents with  . Suicidal  . Medical Clearance    HPI Leslie Villegas is a 20 y.o. female.  Patient w hx depression, c/o feeling depressed for the past month.  Symptoms moderate, persistent.  States intermittent thoughts of suicide,denies specific plan or any attempt at self harm. Indicates has been drinking etoh more than normal, states now on weekends, and occasionally during week.  Denies daily etoh use. Denies hx etoh withdrawal.  Denies other substance abuse. Otherwise indicates recent health at baseline, no recent illness. Denies recent specific stressors. Normal appetite. Denies recent wt loss.      The history is provided by the patient.    Past Medical History:  Diagnosis Date  . Ankle fracture, right 04/21/2014   jumped from a window  . Closed right pilon fracture 04/30/2014  . Eating disorder    anorexia, per mother  . Family history of adverse reaction to anesthesia    mother has hx. of post-op nausea, hypotension and hypothermia post-op  . Irregular periods   . Migraines   . Poor appetite   . Vegetarian diet     Patient Active Problem List   Diagnosis Date Noted  . Closed right pilon fracture 04/30/2014  . ADHD (attention deficit hyperactivity disorder), combined type 01/11/2011  . ODD (oppositional defiant disorder) 01/11/2011  . EATING DISORDER, UNSPECIFIED 03/15/2010  . Anorexia nervosa 06/04/2009    Past Surgical History:  Procedure Laterality Date  . ORIF ANKLE FRACTURE Right 04/30/2014   Procedure: OPEN REDUCTION INTERNAL FIXATION (ORIF) RIGHT BIMALLEOLAR ANKLE FRACTURE;  Surgeon: Eulas Post, MD;  Location: [facility];  Service: Orthopedics;  Laterality: Right;  . TONSILLECTOMY  04/14/2000    OB History    No data available       Home Medications    Prior to Admission  medications   Medication Sig Start Date End Date Taking? Authorizing Provider  etonogestrel (NEXPLANON) 68 MG IMPL implant 1 each by Subdermal route once.    Historical Provider, MD    Family History Family History  Problem Relation Age of Onset  . Anesthesia problems Mother     post-op nausea/hypotension/hypothermia    Social History Social History  Substance Use Topics  . Smoking status: Current Every Day Smoker    Years: 2.00    Types: Cigarettes  . Smokeless tobacco: Never Used     Comment: 2-3 cig./day  . Alcohol use Yes     Comment: occasionally     Allergies   Penicillins   Review of Systems Review of Systems  Constitutional: Negative for chills and fever.  HENT: Negative for sore throat.   Eyes: Negative for redness.  Respiratory: Negative for shortness of breath.   Cardiovascular: Negative for chest pain.  Gastrointestinal: Negative for abdominal pain.  Genitourinary: Negative for flank pain.  Musculoskeletal: Negative for back pain.  Skin: Negative for rash.  Neurological: Negative for headaches.  Hematological: Negative for adenopathy.  Psychiatric/Behavioral: Positive for dysphoric mood.     Physical Exam Updated Vital Signs BP 146/100 (BP Location: Left Arm)   Pulse 68   Temp 98.5 F (36.9 C) (Oral)   Resp 18   Ht 5\' 5"  (1.651 m)   SpO2 99%   Physical Exam  Constitutional: She appears well-developed and well-nourished. No distress.  HENT:  Head: Atraumatic.  Eyes: Conjunctivae are normal. No scleral icterus.  Neck: Neck supple. No tracheal deviation present.  Cardiovascular: Normal rate, regular rhythm, normal heart sounds and intact distal pulses.   Pulmonary/Chest: Effort normal and breath sounds normal. No respiratory distress.  Abdominal: Normal appearance. She exhibits no distension. There is no tenderness.  Musculoskeletal: She exhibits no edema.  Neurological: She is alert.  Skin: Skin is warm and dry. No rash noted.    Psychiatric:  Depressed mood.   Nursing note and vitals reviewed.    ED Treatments / Results  Labs (all labs ordered are listed, but only abnormal results are displayed) Results for orders placed or performed during the hospital encounter of 11/29/15  Comprehensive metabolic panel  Result Value Ref Range   Sodium 134 (L) 135 - 145 mmol/L   Potassium 3.8 3.5 - 5.1 mmol/L   Chloride 100 (L) 101 - 111 mmol/L   CO2 24 22 - 32 mmol/L   Glucose, Bld 91 65 - 99 mg/dL   BUN 20 6 - 20 mg/dL   Creatinine, Ser 1.610.71 0.44 - 1.00 mg/dL   Calcium 9.5 8.9 - 09.610.3 mg/dL   Total Protein 7.4 6.5 - 8.1 g/dL   Albumin 4.5 3.5 - 5.0 g/dL   AST 23 15 - 41 U/L   ALT 15 14 - 54 U/L   Alkaline Phosphatase 61 38 - 126 U/L   Total Bilirubin 0.3 0.3 - 1.2 mg/dL   GFR calc non Af Amer >60 >60 mL/min   GFR calc Af Amer >60 >60 mL/min   Anion gap 10 5 - 15  Ethanol  Result Value Ref Range   Alcohol, Ethyl (B) <5 <5 mg/dL  Salicylate level  Result Value Ref Range   Salicylate Lvl <4.0 2.8 - 30.0 mg/dL  Acetaminophen level  Result Value Ref Range   Acetaminophen (Tylenol), Serum <10 (L) 10 - 30 ug/mL  cbc  Result Value Ref Range   WBC 7.8 4.0 - 10.5 K/uL   RBC 4.49 3.87 - 5.11 MIL/uL   Hemoglobin 13.9 12.0 - 15.0 g/dL   HCT 04.539.9 40.936.0 - 81.146.0 %   MCV 88.9 78.0 - 100.0 fL   MCH 31.0 26.0 - 34.0 pg   MCHC 34.8 30.0 - 36.0 g/dL   RDW 91.412.6 78.211.5 - 95.615.5 %   Platelets 247 150 - 400 K/uL    EKG  EKG Interpretation None       Radiology No results found.  Procedures Procedures (including critical care time)  Medications Ordered in ED Medications - No data to display   Initial Impression / Assessment and Plan / ED Course  I have reviewed the triage vital signs and the nursing notes.  Pertinent labs & imaging results that were available during my care of the patient were reviewed by me and considered in my medical decision making (see chart for details).  Clinical Course   Labs sent.     Behavioral health team consulted.   Reviewed nursing notes and prior charts for additional history.   [facility]     Final Clinical Impressions(s) / ED Diagnoses   Final diagnoses:  None    New Prescriptions New Prescriptions   No medications on file     Cathren LaineKevin Brooke Steinhilber, MD 11/29/15 [id number]

## 2015-11-29 NOTE — ED Notes (Addendum)
Pt's mother to pick her up approx 1300, OK to give visteril instead of ativan per [facility] DNP.  nad.  Pt watching tv

## 2015-11-29 NOTE — ED Notes (Signed)
Pt. Transferred to [facility] from ED to room 37. Report to include Situation, Background, Assessment and Recommendations from [facility]. Pt. Oriented to unit including Q15 minute rounds as well as the security cameras for their protection. Patient is alert and oriented, warm and dry in no acute distress. Patient denies SI, HI, and AVH. Pt. Encouraged to let me know if needs arise.

## 2015-11-29 NOTE — ED Notes (Signed)
Patient wanded 

## 2015-11-29 NOTE — ED Triage Notes (Addendum)
[facility] and was sent over here for medical clearance. [facility], reports she drinks on the weekends, last drink was this morning. She also reports she has been having thoughts of suicide, no plan, just the thoughts and some anxiety and depression as well. Pt c/o chest pain 4/10. EKG ordered on pt r/t her c/o of chest pain.

## 2015-11-29 NOTE — ED Notes (Signed)
Registration in to see 

## 2015-11-29 NOTE — [facility] Suicide Risk Assessment (Signed)
Suicide Risk Assessment  Discharge Assessment   [facility]   Principal Problem: Major depressive disorder, recurrent episode, moderate ([facility]) Discharge Diagnoses:  Patient Active Problem List   Diagnosis Date Noted  . Alcohol abuse [F10.10] 11/29/2015    Priority: High  . Major depressive disorder, recurrent episode, moderate ([facility]) [F33.1] 11/29/2015    Priority: High  . Anxiety [F41.9] 11/29/2015    Priority: High  . Closed right pilon fracture [S82.871A] 04/30/2014  . ADHD (attention deficit hyperactivity disorder), combined type [F90.2] 01/11/2011  . ODD (oppositional defiant disorder) [F91.3] 01/11/2011  . EATING DISORDER, UNSPECIFIED [F50.9] 03/15/2010  . Anorexia nervosa [F50.00] 06/04/2009    Total Time spent with patient: 45 minutes    Musculoskeletal: Strength & Muscle Tone: within normal limits Gait & Station: normal Patient leans: N/A  Psychiatric Specialty Exam: Physical Exam  Constitutional: She is oriented to person, place, and time. She appears well-developed and well-nourished.  HENT:  Head: Normocephalic.  Neck: Normal range of motion.  Respiratory: Effort normal.  Musculoskeletal: Normal range of motion.  Neurological: She is alert and oriented to person, place, and time.  Skin: Skin is warm and dry.  Psychiatric: Her speech is normal and behavior is normal. Judgment and thought content normal. Her mood appears anxious. Cognition and memory are normal. She exhibits a depressed mood.    Review of Systems  Constitutional: Negative.   HENT: Negative.   Eyes: Negative.   Respiratory: Negative.   Cardiovascular: Negative.   Gastrointestinal: Negative.   Genitourinary: Negative.   Musculoskeletal: Negative.   Skin: Negative.   Neurological: Negative.   Endo/Heme/Allergies: Negative.   Psychiatric/Behavioral: Positive for depression and substance abuse. The patient is nervous/anxious.     Blood pressure 121/74, pulse 74,  temperature 98.2 F (36.8 C), temperature source Oral, resp. rate 16, height 5\' 5"  (1.651 m), SpO2 100 %.There is no height or weight on file to calculate BMI.  General Appearance: Casual  Eye Contact:  Good  Speech:  Normal Rate  Volume:  Normal  Mood:  Anxious and Depressed, mild  Affect:  Congruent  Thought Process:  Coherent and Descriptions of Associations: Intact  Orientation:  Full (Time, Place, and Person)  Thought Content:  WDL  Suicidal Thoughts:  No  Homicidal Thoughts:  No  Memory:  Immediate;   Good Recent;   Good Remote;   Good  Judgement:  Fair  Insight:  Fair  Psychomotor Activity:  Normal  Concentration:  Concentration: Good and Attention Span: Good  Recall:  Good  Fund of Knowledge:  Good  Language:  Good  Akathisia:  No  Handed:  Right  AIMS (if indicated):     Assets:  Housing Leisure Time Physical Health Resilience Social Support  ADL's:  Intact  Cognition:  WNL  Sleep:       Mental Status Per Nursing Assessment::   On Admission:   alcohol abuse with depression and suicidal ideations  Demographic Factors:  Adolescent or young adult and Caucasian  Loss Factors: NA  Historical Factors: NA  Risk Reduction Factors:   Sense of responsibility to family, Living with another person, especially a relative and Positive social support  Continued Clinical Symptoms:  Depression and anxiety, mild to moderate  Cognitive Features That Contribute To Risk:  None    Suicide Risk:  Minimal: No identifiable suicidal ideation.  Patients presenting with no risk factors but with morbid ruminations; may be classified as minimal risk based on the severity of the depressive  symptoms    Plan Of Care/Follow-up recommendations:  Activity:  as tolerated Diet:  heart healthy diet  LORD, JAMISON, NP 11/29/2015, 11:41 AM

## 2015-11-29 NOTE — ED Notes (Signed)
Bed: [facility] Expected date:  Expected time:  Means of arrival:  Comments: 

## 2016-05-04 DIAGNOSIS — Z1159 Encounter for screening for other viral diseases: Secondary | ICD-10-CM | POA: Diagnosis not present

## 2016-05-04 DIAGNOSIS — N921 Excessive and frequent menstruation with irregular cycle: Secondary | ICD-10-CM | POA: Diagnosis not present

## 2016-05-04 DIAGNOSIS — Z113 Encounter for screening for infections with a predominantly sexual mode of transmission: Secondary | ICD-10-CM | POA: Diagnosis not present

## 2016-05-04 DIAGNOSIS — Z114 Encounter for screening for human immunodeficiency virus [HIV]: Secondary | ICD-10-CM | POA: Diagnosis not present

## 2016-05-25 DIAGNOSIS — Z3009 Encounter for other general counseling and advice on contraception: Secondary | ICD-10-CM | POA: Diagnosis not present

## 2016-05-25 DIAGNOSIS — Z113 Encounter for screening for infections with a predominantly sexual mode of transmission: Secondary | ICD-10-CM | POA: Diagnosis not present

## 2016-07-18 DIAGNOSIS — R05 Cough: Secondary | ICD-10-CM | POA: Diagnosis not present

## 2016-07-18 DIAGNOSIS — J0100 Acute maxillary sinusitis, unspecified: Secondary | ICD-10-CM | POA: Diagnosis not present

## 2017-01-04 DIAGNOSIS — R300 Dysuria: Secondary | ICD-10-CM | POA: Diagnosis not present

## 2017-01-04 DIAGNOSIS — Z114 Encounter for screening for human immunodeficiency virus [HIV]: Secondary | ICD-10-CM | POA: Diagnosis not present

## 2017-01-04 DIAGNOSIS — N921 Excessive and frequent menstruation with irregular cycle: Secondary | ICD-10-CM | POA: Diagnosis not present

## 2017-01-04 DIAGNOSIS — Z3200 Encounter for pregnancy test, result unknown: Secondary | ICD-10-CM | POA: Diagnosis not present

## 2017-01-04 DIAGNOSIS — Z113 Encounter for screening for infections with a predominantly sexual mode of transmission: Secondary | ICD-10-CM | POA: Diagnosis not present

## 2017-01-04 DIAGNOSIS — Z1159 Encounter for screening for other viral diseases: Secondary | ICD-10-CM | POA: Diagnosis not present

## 2017-01-04 DIAGNOSIS — Z118 Encounter for screening for other infectious and parasitic diseases: Secondary | ICD-10-CM | POA: Diagnosis not present

## 2017-01-04 DIAGNOSIS — R635 Abnormal weight gain: Secondary | ICD-10-CM | POA: Diagnosis not present

## 2017-01-09 DIAGNOSIS — Z3046 Encounter for surveillance of implantable subdermal contraceptive: Secondary | ICD-10-CM | POA: Diagnosis not present

## 2017-01-09 DIAGNOSIS — N921 Excessive and frequent menstruation with irregular cycle: Secondary | ICD-10-CM | POA: Diagnosis not present

## 2017-01-09 DIAGNOSIS — Z309 Encounter for contraceptive management, unspecified: Secondary | ICD-10-CM | POA: Diagnosis not present

## 2017-04-20 DIAGNOSIS — L237 Allergic contact dermatitis due to plants, except food: Secondary | ICD-10-CM | POA: Diagnosis not present

## 2017-04-20 DIAGNOSIS — L209 Atopic dermatitis, unspecified: Secondary | ICD-10-CM | POA: Diagnosis not present

## 2017-05-04 DIAGNOSIS — Z113 Encounter for screening for infections with a predominantly sexual mode of transmission: Secondary | ICD-10-CM | POA: Diagnosis not present

## 2017-05-04 DIAGNOSIS — L237 Allergic contact dermatitis due to plants, except food: Secondary | ICD-10-CM | POA: Diagnosis not present

## 2017-05-04 DIAGNOSIS — Z713 Dietary counseling and surveillance: Secondary | ICD-10-CM | POA: Diagnosis not present

## 2017-05-04 DIAGNOSIS — R109 Unspecified abdominal pain: Secondary | ICD-10-CM | POA: Diagnosis not present

## 2017-05-04 DIAGNOSIS — Z1159 Encounter for screening for other viral diseases: Secondary | ICD-10-CM | POA: Diagnosis not present

## 2017-05-04 DIAGNOSIS — Z114 Encounter for screening for human immunodeficiency virus [HIV]: Secondary | ICD-10-CM | POA: Diagnosis not present

## 2017-05-04 DIAGNOSIS — Z6826 Body mass index (BMI) 26.0-26.9, adult: Secondary | ICD-10-CM | POA: Diagnosis not present

## 2017-05-04 DIAGNOSIS — Z118 Encounter for screening for other infectious and parasitic diseases: Secondary | ICD-10-CM | POA: Diagnosis not present

## 2017-07-27 DIAGNOSIS — Z118 Encounter for screening for other infectious and parasitic diseases: Secondary | ICD-10-CM | POA: Diagnosis not present

## 2017-07-27 DIAGNOSIS — R109 Unspecified abdominal pain: Secondary | ICD-10-CM | POA: Diagnosis not present

## 2017-07-27 DIAGNOSIS — Z113 Encounter for screening for infections with a predominantly sexual mode of transmission: Secondary | ICD-10-CM | POA: Diagnosis not present

## 2017-07-27 DIAGNOSIS — N760 Acute vaginitis: Secondary | ICD-10-CM | POA: Diagnosis not present

## 2017-08-03 DIAGNOSIS — Z640 Problems related to unwanted pregnancy: Secondary | ICD-10-CM | POA: Diagnosis not present

## 2017-08-07 DIAGNOSIS — Z640 Problems related to unwanted pregnancy: Secondary | ICD-10-CM | POA: Diagnosis not present

## 2017-08-10 DIAGNOSIS — Z640 Problems related to unwanted pregnancy: Secondary | ICD-10-CM | POA: Diagnosis not present

## 2017-09-11 DIAGNOSIS — G43009 Migraine without aura, not intractable, without status migrainosus: Secondary | ICD-10-CM | POA: Diagnosis not present

## 2017-10-10 DIAGNOSIS — Z113 Encounter for screening for infections with a predominantly sexual mode of transmission: Secondary | ICD-10-CM | POA: Diagnosis not present

## 2017-10-10 DIAGNOSIS — Z114 Encounter for screening for human immunodeficiency virus [HIV]: Secondary | ICD-10-CM | POA: Diagnosis not present

## 2017-10-10 DIAGNOSIS — Z30017 Encounter for initial prescription of implantable subdermal contraceptive: Secondary | ICD-10-CM | POA: Diagnosis not present

## 2017-11-09 DIAGNOSIS — Z113 Encounter for screening for infections with a predominantly sexual mode of transmission: Secondary | ICD-10-CM | POA: Diagnosis not present

## 2018-03-05 DIAGNOSIS — B9689 Other specified bacterial agents as the cause of diseases classified elsewhere: Secondary | ICD-10-CM | POA: Diagnosis not present

## 2018-03-05 DIAGNOSIS — J069 Acute upper respiratory infection, unspecified: Secondary | ICD-10-CM | POA: Diagnosis not present

## 2018-03-05 DIAGNOSIS — R300 Dysuria: Secondary | ICD-10-CM | POA: Diagnosis not present

## 2018-03-16 DIAGNOSIS — Z113 Encounter for screening for infections with a predominantly sexual mode of transmission: Secondary | ICD-10-CM | POA: Diagnosis not present

## 2018-03-16 DIAGNOSIS — Z114 Encounter for screening for human immunodeficiency virus [HIV]: Secondary | ICD-10-CM | POA: Diagnosis not present

## 2018-03-16 DIAGNOSIS — Z3046 Encounter for surveillance of implantable subdermal contraceptive: Secondary | ICD-10-CM | POA: Diagnosis not present

## 2018-11-29 DIAGNOSIS — J029 Acute pharyngitis, unspecified: Secondary | ICD-10-CM | POA: Diagnosis not present

## 2018-11-29 DIAGNOSIS — R5383 Other fatigue: Secondary | ICD-10-CM | POA: Diagnosis not present

## 2019-01-08 ENCOUNTER — Emergency Department (HOSPITAL_COMMUNITY): Payer: Self-pay

## 2019-01-08 ENCOUNTER — Other Ambulatory Visit: Payer: Self-pay

## 2019-01-08 ENCOUNTER — Emergency Department (HOSPITAL_COMMUNITY)
Admission: EM | Admit: 2019-01-08 | Discharge: 2019-01-09 | Disposition: A | Discharge status: Home / Self Care | Payer: Self-pay | Attending: Emergency Medicine | Admitting: Emergency Medicine

## 2019-01-08 ENCOUNTER — Encounter (HOSPITAL_COMMUNITY): Payer: Self-pay | Admitting: Emergency Medicine

## 2019-01-08 DIAGNOSIS — R002 Palpitations: Secondary | ICD-10-CM | POA: Insufficient documentation

## 2019-01-08 DIAGNOSIS — F17210 Nicotine dependence, cigarettes, uncomplicated: Secondary | ICD-10-CM | POA: Insufficient documentation

## 2019-01-08 DIAGNOSIS — E860 Dehydration: Secondary | ICD-10-CM | POA: Insufficient documentation

## 2019-01-08 DIAGNOSIS — R079 Chest pain, unspecified: Secondary | ICD-10-CM

## 2019-01-08 LAB — CBC
HCT: 44 % (ref 36.0–46.0)
Hemoglobin: 15.4 g/dL — ABNORMAL HIGH (ref 12.0–15.0)
MCH: 32.4 pg (ref 26.0–34.0)
MCHC: 35 g/dL (ref 30.0–36.0)
MCV: 92.6 fL (ref 80.0–100.0)
Platelets: 327 10*3/uL (ref 150–400)
RBC: 4.75 MIL/uL (ref 3.87–5.11)
RDW: 12.4 % (ref 11.5–15.5)
WBC: 10.7 10*3/uL — ABNORMAL HIGH (ref 4.0–10.5)
nRBC: 0 % (ref 0.0–0.2)

## 2019-01-08 LAB — BASIC METABOLIC PANEL
Anion gap: 18 — ABNORMAL HIGH (ref 5–15)
BUN: 11 mg/dL (ref 6–20)
CO2: 24 mmol/L (ref 22–32)
Calcium: 10.4 mg/dL — ABNORMAL HIGH (ref 8.9–10.3)
Chloride: 96 mmol/L — ABNORMAL LOW (ref 98–111)
Creatinine, Ser: 0.66 mg/dL (ref 0.44–1.00)
GFR calc Af Amer: >60 mL/min (ref >60)
GFR calc non Af Amer: >60 mL/min (ref >60)
Glucose, Bld: 86 mg/dL (ref 70–99)
Potassium: 4 mmol/L (ref 3.5–5.1)
Sodium: 138 mmol/L (ref 135–145)

## 2019-01-08 LAB — TROPONIN I (HIGH SENSITIVITY): Troponin I (High Sensitivity): 3 ng/L (ref <18)

## 2019-01-08 LAB — I-STAT BETA HCG BLOOD, ED (MC, WL, AP ONLY): I-stat hCG, quantitative: <5 m[IU]/mL (ref <5)

## 2019-01-08 MED ORDER — SODIUM CHLORIDE 0.9% FLUSH: 3.0000 mL | Freq: Once | INTRAVENOUS | Status: DC

## 2019-01-08 NOTE — ED Triage Notes (Signed)
Pt states she has been having palpitations for several weeks. Today it has worsened. Pt complains of CP today as well.

## 2019-01-09 LAB — TROPONIN I (HIGH SENSITIVITY): Troponin I (High Sensitivity): <2 ng/L (ref <18)

## 2019-01-09 LAB — MAGNESIUM: Magnesium: 1.9 mg/dL (ref 1.7–2.4)

## 2019-01-09 LAB — TSH: TSH: 1.472 u[IU]/mL (ref 0.350–4.500)

## 2019-01-09 MED ORDER — SODIUM CHLORIDE 0.9 % IV BOLUS
1000.0000 mL | Freq: Once | INTRAVENOUS | Status: AC
  Administered 2019-01-09: 09:00:00 1000 mL via INTRAVENOUS

## 2019-01-09 NOTE — ED Provider Notes (Signed)
[facility] EMERGENCY DEPARTMENT Provider Note   CSN: [id number] Arrival date & time: 01/08/19  1650     History   Chief Complaint Chief Complaint  Patient presents with  . Chest Pain    HPI Leslie Villegas is a 23 y.o. female.     The history is provided by the patient and medical records. No language interpreter was used.  Palpitations Palpitations quality:  Irregular Onset quality:  Gradual Duration:  3 weeks (months of them worsening over last few weeks) Timing:  Intermittent Progression:  Waxing and waning Chronicity:  Chronic Context: anxiety   Relieved by:  Nothing (took a metorolol yesterday) Worsened by:  Nothing Ineffective treatments:  Beta blockers Associated symptoms: chest pain, chest pressure and shortness of breath   Associated symptoms: no back pain, no cough, no diaphoresis, no dizziness, no leg pain, no lower extremity edema, no malaise/fatigue, no nausea, no numbness, no syncope, no vomiting and no weakness   Risk factors: stress   Risk factors: no hx of PE     Past Medical History:  Diagnosis Date  . Ankle fracture, right 04/21/2014   jumped from a window  . Closed right pilon fracture 04/30/2014  . Eating disorder    anorexia, per mother  . Family history of adverse reaction to anesthesia    mother has hx. of post-op nausea, hypotension and hypothermia post-op  . Irregular periods   . Migraines   . Poor appetite   . Vegetarian diet     Patient Active Problem List   Diagnosis Date Noted  . Alcohol abuse 11/29/2015  . Major depressive disorder, recurrent episode, moderate (HCC) 11/29/2015  . Anxiety 11/29/2015  . Closed right pilon fracture 04/30/2014  . ADHD (attention deficit hyperactivity disorder), combined type 01/11/2011  . ODD (oppositional defiant disorder) 01/11/2011  . EATING DISORDER, UNSPECIFIED 03/15/2010  . Anorexia nervosa 06/04/2009    Past Surgical History:  Procedure Laterality Date  . ORIF ANKLE  FRACTURE Right 04/30/2014   Procedure: OPEN REDUCTION INTERNAL FIXATION (ORIF) RIGHT BIMALLEOLAR ANKLE FRACTURE;  Surgeon: Eulas PostJoshua P Landau, MD;  Location: [facility];  Service: Orthopedics;  Laterality: Right;  . TONSILLECTOMY  04/14/2000     OB History   No obstetric history on file.      Home Medications    Prior to Admission medications   Medication Sig Start Date End Date Taking? Authorizing Provider  etonogestrel (NEXPLANON) 68 MG IMPL implant 1 each by Subdermal route once.    [provider]  hydrOXYzine (ATARAX/VISTARIL) 25 MG tablet Take 1 tablet (25 mg total) by mouth every 6 (six) hours as needed (anxiety/agitation or CIWA < or = 10). 11/29/15   Charm RingsLord, Jamison Y, NP    Family History Family History  Problem Relation Age of Onset  . Anesthesia problems Mother        post-op nausea/hypotension/hypothermia    Social History Social History   Tobacco Use  . Smoking status: Current Every Day Smoker    Years: 2.00    Types: Cigarettes  . Smokeless tobacco: Never Used  . Tobacco comment: 2-3 cig./day  Substance Use Topics  . Alcohol use: Yes    Comment: occasionally  . Drug use: No     Allergies   Penicillins   Review of Systems Review of Systems  Constitutional: Negative for chills, diaphoresis, fatigue, fever and malaise/fatigue.  HENT: Negative for congestion and rhinorrhea.   Eyes: Negative for visual disturbance.  Respiratory: Positive for shortness  of breath. Negative for cough, choking, chest tightness, wheezing and stridor.   Cardiovascular: Positive for chest pain and palpitations. Negative for leg swelling and syncope.  Gastrointestinal: Negative for abdominal pain, constipation, diarrhea, nausea and vomiting.  Genitourinary: Negative for dysuria.  Musculoskeletal: Negative for back pain, neck pain and neck stiffness.  Skin: Negative for rash and wound.  Neurological: Negative for dizziness, weakness, light-headedness, numbness  and headaches.  Psychiatric/Behavioral: Negative for agitation.  All other systems reviewed and are negative.    Physical Exam Updated Vital Signs BP (!) 148/101 (BP Location: Left Arm)   Pulse 88   Temp 97.9 F (36.6 C) (Oral)   Resp 18   Ht  (1.651 m)   Wt 68 kg   LMP 12/08/2018 (Within Days)   SpO2 100%   BMI 24.96 kg/m   Physical Exam Vitals signs and nursing note reviewed.  Constitutional:      General: She is not in acute distress.    Appearance: She is well-developed. She is not ill-appearing, toxic-appearing or diaphoretic.  HENT:     Head: Normocephalic and atraumatic.     Right Ear: External ear normal.     Left Ear: External ear normal.     Nose: Nose normal.     Mouth/Throat:     Pharynx: No oropharyngeal exudate.  Eyes:     Conjunctiva/sclera: Conjunctivae normal.     Pupils: Pupils are equal, round, and reactive to light.  Neck:     Musculoskeletal: Normal range of motion and neck supple.  Cardiovascular:     Rate and Rhythm: Normal rate and regular rhythm.  Extrasystoles are present.    Heart sounds: Normal heart sounds. No murmur.  Pulmonary:     Effort: Pulmonary effort is normal. No tachypnea or respiratory distress.     Breath sounds: No stridor. No decreased breath sounds, wheezing, rhonchi or rales.  Abdominal:     General: There is no distension.     Tenderness: There is no abdominal tenderness. There is no rebound.  Musculoskeletal:     Right lower leg: She exhibits no tenderness. No edema.     Left lower leg: She exhibits no tenderness. No edema.  Skin:    General: Skin is warm.     Capillary Refill: Capillary refill takes less than 2 seconds.     Coloration: Skin is not pale.     Findings: No erythema or rash.  Neurological:     General: No focal deficit present.     Mental Status: She is alert and oriented to person, place, and time.     Motor: No abnormal muscle tone.     Coordination: Coordination normal.     Deep Tendon  Reflexes: Reflexes are normal and symmetric.  Psychiatric:        Mood and Affect: Mood normal.      ED Treatments / Results  Labs (all labs ordered are listed, but only abnormal results are displayed) Labs Reviewed  BASIC METABOLIC PANEL - Abnormal; Notable for the following components:      Result Value   Chloride 96 (*)    Calcium 10.4 (*)    Anion gap 18 (*)    All other components within normal limits  CBC - Abnormal; Notable for the following components:   WBC 10.7 (*)    Hemoglobin 15.4 (*)    All other components within normal limits  MAGNESIUM  TSH  I-STAT BETA HCG BLOOD, ED ([facility], [facility], [facility] ONLY)  TROPONIN I (HIGH SENSITIVITY)  TROPONIN I (HIGH SENSITIVITY)  TROPONIN I (HIGH SENSITIVITY)    EKG EKG Interpretation  Date/Time:  Tuesday January 08 2019 17:00:35 EST Ventricular Rate:  87 PR Interval:  128 QRS Duration: 80 QT Interval:  342 QTC Calculation: 411 R Axis:   77 Text Interpretation: Sinus rhythm with Premature supraventricular complexes Otherwise normal ECG When compared to prior, new PVC. No STEMI Confirmed by Theda Belfast ([id number]) on 01/09/2019 7:48:24 AM   Radiology Dg Chest 2 View  Result Date: 01/08/2019 CLINICAL DATA:  Chest pain and palpitations for approximately 2 weeks. Shortness of breath on exertion EXAM: CHEST - 2 VIEW COMPARISON:  None. FINDINGS: The heart size and mediastinal contours are within normal limits. Both lungs are clear. The visualized skeletal structures are unremarkable. IMPRESSION: Normal study. Electronically Signed   By: Danae Orleans M.D.   On: 01/08/2019 17:18    Procedures Procedures (including critical care time)  Medications Ordered in ED Medications  sodium chloride flush (NS) 0.9 % injection 3 mL (has no administration in time range)  sodium chloride 0.9 % bolus 1,000 mL (1,000 mLs Intravenous New Bag/Given 01/09/19 0910)     Initial Impression / Assessment and Plan / ED Course  I have reviewed the triage vital  signs and the nursing notes.  Pertinent labs & imaging results that were available during my care of the patient were reviewed by me and considered in my medical decision making (see chart for details).        Leslie Villegas is a 23 y.o. female with a past medical history significant for ADHD, depression, anxiety, and prior eating disorders who presents with painful palpitations.  Patient reports over the last 3 months has had palpitations over the last week they worsen.  She says that she is having them very frequently and it is interrupting her sleep and causing her anxiety and stress.  She reports that the pain feels like a thumping pressure.  She reports it is not a constant pressure.  She reports it is not exertional or pleuritic.  She does report some shortness of breath with it at times.  She denies nausea, vomiting, urinary symptoms or other GI symptoms.  She denies any trauma.  She reports that she has taken some supplementation shakes as she is trying to eat healthier.  She reports that she took a metoprolol yesterday from a friend's mother because she thought it might help with palpitations but it did not help.  She denies any syncopal episodes.  She denies any recent fevers, chills, headache, or neck pain.  She denies any family history or personal history of cardiac arrhythmias, injuries, or death.  She denies history of DVT or PE and does not take oral birth control medication.  She denies leg pain or leg swelling.  Patient has been waiting in the emergency department for approximately 15 hours prior to my evaluation.  Patient reports that she has been trying to decrease her caffeine intake but has had increase in stress with work.  On exam, chest is nontender and lungs are clear.  No murmur.  Back nontender.  Abdomen nontender.  Legs have no tenderness or swelling.  Vital signs are reassuring on initial evaluation.  EKG shows no STEMI but does show multiple PVCs.  This is likely the  cause of the palpitations.  Patient had some screening labs done in triage showing normal potassium and kidney function.  Patient does have slightly elevated calcium.  Patient has  likely some hemoconcentration with elevated hemoglobin and white blood cells.  Initial troponin is negative.  She is not pregnant.  We agreed to give the patient some fluids, repeat a troponin, and get a magnesium level due to the palpitations and PVCs seen.  She is PERC negative.   If work-up remains reassuring and she is feeling better, anticipate follow-up with cardiology for further management of her symptomatic palpitations.  Anticipate discharge after repeat labs and rehydration.  After rehydration, patient was feeling better with less palpitations.  Chest pain improved.  Patient's TSH was normal and magnesium was normal.  Second troponin was negative.  Given improvement and reassuring work-up, patient was well stable for discharge home.  Patient recommended follow-up with cardiology for possible Holter monitoring if she had further symptoms.  Patient is agreeable this plan and return precautions.  She no other questions or concerns and was discharged in good condition.   Final Clinical Impressions(s) / ED Diagnoses   Final diagnoses:  Palpitations  Chest pain, unspecified type  Dehydration    ED Discharge Orders    None      Clinical Impression: 1. Palpitations   2. Chest pain, unspecified type   3. Dehydration     Disposition: Discharge  Condition: Good  I have discussed the results, Dx and Tx plan with the pt(& family if present). He/she/they expressed understanding and agree(s) with the plan. Discharge instructions discussed at great length. Strict return precautions discussed and pt &/or family have verbalized understanding of the instructions. No further questions at time of discharge.    New Prescriptions   No medications on file    Follow Up: [place] [id number] [place] [id number] [phone] Schedule an appointment as soon as possible for a visit    [place] [address] [phone] [place]       , Gwenyth Allegra, MD 01/09/19 1054

## 2019-01-09 NOTE — Discharge Instructions (Signed)
Your work-up today was overall reassuring.  We did find some evidence of dehydration.  After the fluids, your palpitations improved and we suspect there is a relation.  Your cardiac enzymes were negative both times we checked them and your electrolytes were otherwise reassuring.  Your thyroid was normal and your x-ray was reassuring.  Please follow-up with cardiology for further palpitation management as well as a primary doctor.  If any symptoms change or worsen, please return to the nearest emergency department.  Please avoid caffeine and try to rest and stay hydrated.

## 2019-01-21 NOTE — Progress Notes (Signed)
Cardiology Office Note:    Date:  01/23/2019   ID:  Leslie Villegas, DOB [birth year], MRN [id number]  PCP:  Patient, No Pcp Per  Cardiologist:  No primary care provider on file.  Electrophysiologist:  None   Referring MD: Tegeler, Canary Brim,*   Chief Complaint  Patient presents with  . Palpitations    History of Present Illness:    Leslie Villegas is a 23 y.o. female with a hx of ADHD, depression, anxiety, and prior eating disorders who presents for initial evaluation of palpitations.  She was seen in the ED on 01/08/2019.  She had worsening palpitations over the prior week.  EKG showed PVCs.  Labs showed mildly reduced magnesium (1.9) but was otherwise unremarkable.    Reports the palpitations started a couple months ago.  States that it feels like heart beat is irregular.  Usually occurs for a couple of minutes but can last up to 1 day.  She presented to the ED on 11/30 because palpitations have been occurring for 2 days straight.  Following her ED visit, she was told to cut back on her caffeine intake.  She previously was drinking 6 shots of espresso per day, and is now down to 2 cups of coffee per day.  She has not noticed any improvement in her palpitations.  No family history of heart disease.  Does not smoke.  Past Medical History:  Diagnosis Date  . Ankle fracture, right 04/21/2014   jumped from a window  . Closed right pilon fracture 04/30/2014  . Eating disorder    anorexia, per mother  . Family history of adverse reaction to anesthesia    mother has hx. of post-op nausea, hypotension and hypothermia post-op  . Irregular periods   . Migraines   . Poor appetite   . Vegetarian diet     Past Surgical History:  Procedure Laterality Date  . ORIF ANKLE FRACTURE Right 04/30/2014   Procedure: OPEN REDUCTION INTERNAL FIXATION (ORIF) RIGHT BIMALLEOLAR ANKLE FRACTURE;  Surgeon: Eulas Post, MD;  Location: [facility];  Service: Orthopedics;  Laterality:  Right;  . TONSILLECTOMY  04/14/2000    Current Medications: No outpatient medications have been marked as taking for the 01/23/19 encounter (Office Visit) with Little Ishikawa, MD.     Allergies:   Penicillins   Social History   Socioeconomic History  . Marital status: Single    Spouse name: Not on file  . Number of children: Not on file  . Years of education: Not on file  . Highest education level: Not on file  Occupational History  . Not on file  Social Needs  . Financial resource strain: Not on file  . Food insecurity    Worry: Not on file    Inability: Not on file  . Transportation needs    Medical: Not on file    Non-medical: Not on file  Tobacco Use  . Smoking status: Current Every Day Smoker    Years: 2.00    Types: Cigarettes  . Smokeless tobacco: Never Used  . Tobacco comment: 2-3 cig./day  Substance and Sexual Activity  . Alcohol use: Yes    Comment: occasionally  . Drug use: No  . Sexual activity: Never  Lifestyle  . Physical activity    Days per week: Not on file    Minutes per session: Not on file  . Stress: Not on file  Relationships  . Social connections    Talks on phone: Not on  file    Gets together: Not on file    Attends religious service: Not on file    Active member of club or organization: Not on file    Attends meetings of clubs or organizations: Not on file    Relationship status: Not on file  Other Topics Concern  . Not on file  Social History Narrative  . Not on file     Family History: The patient's family history includes Anesthesia problems in her mother.  ROS:   Please see the history of present illness.     All other systems reviewed and are negative.  EKGs/Labs/Other Studies Reviewed:    The following studies were reviewed today:   EKG:  EKG is  ordered today.  The ekg ordered today demonstrates sinus rhythm, rate 74, PVC  Recent Labs: 01/08/2019: BUN 11; Creatinine, Ser 0.66; Hemoglobin 15.4; Platelets 327;  Potassium 4.0; Sodium 138 01/09/2019: Magnesium 1.9; TSH 1.472  Recent Lipid Panel No results found for: CHOL, TRIG, HDL, CHOLHDL, VLDL, LDLCALC, LDLDIRECT  Physical Exam:    VS:  BP 122/86   Pulse 74   Ht 5\' 5"  (1.651 m)   Wt 154 lb 12.8 oz (70.2 kg)   BMI 25.76 kg/m     Wt Readings from Last 3 Encounters:  01/23/19 154 lb 12.8 oz (70.2 kg)  01/08/19 150 lb (68 kg)  04/30/14 118 lb (53.5 kg)     GEN: Well nourished, well developed in no acute distress HEENT: Normal NECK: No JVD; No carotid bruits LYMPHATICS: No lymphadenopathy CARDIAC: Irregular, normal rate, no murmurs, rubs, gallops RESPIRATORY:  Clear to auscultation without rales, wheezing or rhonchi  ABDOMEN: Soft, non-tender, non-distended MUSCULOSKELETAL:  No edema; No deformity  SKIN: Warm and dry NEUROLOGIC:  Alert and oriented x 3 PSYCHIATRIC:  Normal affect   ASSESSMENT:    1. PVC (premature ventricular contraction)   2. Medication management   3. Palpitations    PLAN:    In order of problems listed above:  PVCs: Symptomatic, reports palpitations. -Given symptomatic, will start metoprolol 25 mg twice daily -TTE to rule out structural heart disease -Zio patch x3 days to quantify PVC burden -Given mildly reduced magnesium in ED, will recheck BMET and magnesium -Encouraged to cut back on caffeine use  RTC in 3 months   Medication Adjustments/Labs and Tests Ordered: Current medicines are reviewed at length with the patient today.  Concerns regarding medicines are outlined above.  Orders Placed This Encounter  Procedures  . BMET  . Magnesium  . ZIO PATCH  . EKG 12-Lead  . ECHOCARDIOGRAM COMPLETE   Meds ordered this encounter  Medications  . DISCONTD: metoprolol succinate (TOPROL XL) 25 MG 24 hr tablet    Sig: Take 1 tablet (25 mg total) by mouth 2 (two) times daily.    Dispense:  60 tablet    Refill:  11  . metoprolol succinate (TOPROL XL) 25 MG 24 hr tablet    Sig: Take 1 tablet (25 mg  total) by mouth 2 (two) times daily.    Dispense:  60 tablet    Refill:  11    Patient Instructions  Medication Instructions:  START METOPROLOL 25MG  TWICE DAILY If you need a refill on your cardiac medications before your next appointment, please call your pharmacy.  Labwork: BMET AND MAG TODAY HERE IN OUR OFFICE AT [facility]   You will need to fast. DO NOT EAT OR DRINK PAST MIDNIGHT.     You will NOT need  to fast   If you have labs (blood work) drawn today and your tests are completely normal, you will receive your results only by: Marland Kitchen. MyChart Message (if you have MyChart) OR . A paper copy in the mail If you have any lab test that is abnormal or we need to change your treatment, we will call you to review the results.  Testing/Procedures: Echocardiogram - Your physician has requested that you have an echocardiogram. Echocardiography is a painless test that uses sound waves to create images of your heart. It provides your doctor with information about the size and shape of your heart and how well your heart's chambers and valves are working. This procedure takes approximately one hour. There are no restrictions for this procedure. [facility] - [address], [address].  Your physician has recommended that you wear a 3 DAY ZIO-PATCH monitor. The Zio patch cardiac monitor continuously records heart rhythm data for up to 14 days, this is for patients being evaluated for multiple types heart rhythms. For the first 24 hours post application, please avoid getting the Zio monitor wet in the shower or by excessive sweating during exercise. After that, feel free to carry on with regular activities. Keep soaps and lotions away from the ZIO XT Patch.  This will be mailed to you, please expect 7-10 days to receive.    Follow-Up: IN 3 months  In Person Epifanio Lescheshristopher Merial Moritz, MD.    At [facility], you and your health needs are our priority.  As part of our  continuing mission to provide you with exceptional heart care, we have created designated Provider Care Teams.  These Care Teams include your primary Cardiologist (physician) and Advanced Practice Providers (APPs -  Physician Assistants and Nurse Practitioners) who all work together to provide you with the care you need, when you need it.  Thank you for choosing [facility] at [facility]!!          Signed, Little Ishikawahristopher L Glennette Galster, MD  01/23/2019 4:45 PM    [facility]

## 2019-01-23 ENCOUNTER — Ambulatory Visit (INDEPENDENT_AMBULATORY_CARE_PROVIDER_SITE_OTHER): Payer: Self-pay | Admitting: Cardiology

## 2019-01-23 ENCOUNTER — Other Ambulatory Visit: Payer: Self-pay

## 2019-01-23 ENCOUNTER — Encounter: Payer: Self-pay | Admitting: Cardiology

## 2019-01-23 VITALS — BP 122/86 | HR 74 | Ht 65.0 in | Wt 154.8 lb

## 2019-01-23 DIAGNOSIS — R002 Palpitations: Secondary | ICD-10-CM

## 2019-01-23 DIAGNOSIS — Z79899 Other long term (current) drug therapy: Secondary | ICD-10-CM

## 2019-01-23 DIAGNOSIS — I493 Ventricular premature depolarization: Secondary | ICD-10-CM | POA: Diagnosis not present

## 2019-01-23 MED ORDER — METOPROLOL SUCCINATE ER 25 MG PO TB24: 25.0000 mg | ORAL_TABLET | Freq: Two times a day (BID) | ORAL | 11 refills | Status: AC

## 2019-01-23 MED ORDER — METOPROLOL SUCCINATE ER 25 MG PO TB24: 25.0000 mg | ORAL_TABLET | Freq: Two times a day (BID) | ORAL | 11 refills | Status: DC

## 2019-01-23 NOTE — Patient Instructions (Signed)
Medication Instructions:  START METOPROLOL 25MG  TWICE DAILY If you need a refill on your cardiac medications before your next appointment, please call your pharmacy.  Labwork: BMET AND MAG TODAY HERE IN OUR OFFICE AT [facility]   You will need to fast. DO NOT EAT OR DRINK PAST MIDNIGHT.     You will NOT need to fast   If you have labs (blood work) drawn today and your tests are completely normal, you will receive your results only by: Marland Kitchen MyChart Message (if you have MyChart) OR . A paper copy in the mail If you have any lab test that is abnormal or we need to change your treatment, we will call you to review the results.  Testing/Procedures: Echocardiogram - Your physician has requested that you have an echocardiogram. Echocardiography is a painless test that uses sound waves to create images of your heart. It provides your doctor with information about the size and shape of your heart and how well your heart's chambers and valves are working. This procedure takes approximately one hour. There are no restrictions for this procedure. This will be performed at our [facility] location - [address], [address].  Your physician has recommended that you wear a 3 DAY ZIO-PATCH monitor. The Zio patch cardiac monitor continuously records heart rhythm data for up to 14 days, this is for patients being evaluated for multiple types heart rhythms. For the first 24 hours post application, please avoid getting the Zio monitor wet in the shower or by excessive sweating during exercise. After that, feel free to carry on with regular activities. Keep soaps and lotions away from the ZIO XT Patch.  This will be mailed to you, please expect 7-10 days to receive.    Follow-Up: IN 3 months  In Person Oswaldo Milian, MD.    At [facility], you and your health needs are our priority.  As part of our continuing mission to provide you with exceptional heart care, we have created designated Provider Care Teams.   These Care Teams include your primary Cardiologist (physician) and Advanced Practice Providers (APPs -  Physician Assistants and Nurse Practitioners) who all work together to provide you with the care you need, when you need it.  Thank you for choosing [facility] at [facility]!!

## 2019-01-24 LAB — MAGNESIUM: Magnesium: 2 mg/dL (ref 1.6–2.3)

## 2019-01-24 LAB — BASIC METABOLIC PANEL
BUN/Creatinine Ratio: 16 (ref 9–23)
BUN: 11 mg/dL (ref 6–20)
CO2: 23 mmol/L (ref 20–29)
Calcium: 10.3 mg/dL — ABNORMAL HIGH (ref 8.7–10.2)
Chloride: 100 mmol/L (ref 96–106)
Creatinine, Ser: 0.68 mg/dL (ref 0.57–1.00)
GFR calc Af Amer: 141 mL/min/{1.73_m2} (ref >59)
GFR calc non Af Amer: 122 mL/min/{1.73_m2} (ref >59)
Glucose: 72 mg/dL (ref 65–99)
Potassium: 4.7 mmol/L (ref 3.5–5.2)
Sodium: 140 mmol/L (ref 134–144)

## 2019-01-30 ENCOUNTER — Telehealth: Payer: Self-pay

## 2019-01-30 NOTE — Telephone Encounter (Signed)
3 day ZIO XT ordered and mailed to pt. 

## 2019-02-06 DIAGNOSIS — K529 Noninfective gastroenteritis and colitis, unspecified: Secondary | ICD-10-CM | POA: Diagnosis not present

## 2019-02-08 ENCOUNTER — Other Ambulatory Visit: Payer: Self-pay

## 2019-02-08 ENCOUNTER — Ambulatory Visit (HOSPITAL_COMMUNITY): Payer: Self-pay | Attending: Cardiology

## 2019-02-08 DIAGNOSIS — I493 Ventricular premature depolarization: Secondary | ICD-10-CM | POA: Insufficient documentation

## 2019-04-23 ENCOUNTER — Ambulatory Visit: Payer: Self-pay | Admitting: Cardiology

## 2019-04-23 NOTE — Progress Notes (Deleted)
Cardiology Office Note:    Date:  04/23/2019   ID:  Leslie Villegas, DOB 1996-03-04, MRN [id number]  PCP:  Patient, No Pcp Per  Cardiologist:  No primary care provider on file.  Electrophysiologist:  None   Referring MD: No ref. provider found   No chief complaint on file.   History of Present Illness:    Leslie Villegas is a 24 y.o. female with a hx of ADHD, depression, anxiety, and prior eating disorders who presents for follow-up of palpitations.  She was seen in the ED on 01/08/2019.  She had worsening palpitations over the prior week.  EKG showed PVCs.  Labs showed mildly reduced magnesium (1.9) but was otherwise unremarkable.  Reports the palpitations started a couple months prior to this.  States that it feels like heart beat is irregular.  Usually occurs for a couple of minutes but can last up to 1 day.  She presented to the ED on 11/30 because palpitations have been occurring for 2 days straight.  Following her ED visit, she was told to cut back on her caffeine intake.  She previously was drinking 6 shots of espresso per day, and is now down to 2 cups of coffee per day.  She has not noticed any improvement in her palpitations.  No family history of heart disease.  Does not smoke.  TTE on 02/08/2019 showed no abnormalities.  Past Medical History:  Diagnosis Date  . Ankle fracture, right 04/21/2014   jumped from a window  . Closed right pilon fracture 04/30/2014  . Eating disorder    anorexia, per mother  . Family history of adverse reaction to anesthesia    mother has hx. of post-op nausea, hypotension and hypothermia post-op  . Irregular periods   . Migraines   . Poor appetite   . Vegetarian diet     Past Surgical History:  Procedure Laterality Date  . ORIF ANKLE FRACTURE Right 04/30/2014   Procedure: OPEN REDUCTION INTERNAL FIXATION (ORIF) RIGHT BIMALLEOLAR ANKLE FRACTURE;  Surgeon: Johnny Bridge, MD;  Location: [facility];  Service: Orthopedics;   Laterality: Right;  . TONSILLECTOMY  04/14/2000    Current Medications: No outpatient medications have been marked as taking for the 04/23/19 encounter (Appointment) with Donato Heinz, MD.     Allergies:   Penicillins   Social History   Socioeconomic History  . Marital status: Single    Spouse name: Not on file  . Number of children: Not on file  . Years of education: Not on file  . Highest education level: Not on file  Occupational History  . Not on file  Tobacco Use  . Smoking status: Current Every Day Smoker    Years: 2.00    Types: Cigarettes  . Smokeless tobacco: Never Used  . Tobacco comment: 2-3 cig./day  Substance and Sexual Activity  . Alcohol use: Yes    Comment: occasionally  . Drug use: No  . Sexual activity: Never  Other Topics Concern  . Not on file  Social History Narrative  . Not on file   Social Determinants of Health   Financial Resource Strain:   . Difficulty of Paying Living Expenses: Not on file  Food Insecurity:   . Worried About Charity fundraiser in the Last Year: Not on file  . Ran Out of Food in the Last Year: Not on file  Transportation Needs:   . Lack of Transportation (Medical): Not on file  . Lack of Transportation (  Non-Medical): Not on file  Physical Activity:   . Days of Exercise per Week: Not on file  . Minutes of Exercise per Session: Not on file  Stress:   . Feeling of Stress : Not on file  Social Connections:   . Frequency of Communication with Friends and Family: Not on file  . Frequency of Social Gatherings with Friends and Family: Not on file  . Attends Religious Services: Not on file  . Active Member of Clubs or Organizations: Not on file  . Attends Banker Meetings: Not on file  . Marital Status: Not on file     Family History: The patient's family history includes Anesthesia problems in her mother.  ROS:   Please see the history of present illness.     All other systems reviewed and are  negative.  EKGs/Labs/Other Studies Reviewed:    The following studies were reviewed today:   EKG:  EKG is  ordered today.  The ekg ordered today demonstrates sinus rhythm, rate 74, PVC  Recent Labs: 01/08/2019: Hemoglobin 15.4; Platelets 327 01/09/2019: TSH 1.472 01/23/2019: BUN 11; Creatinine, Ser 0.68; Magnesium 2.0; Potassium 4.7; Sodium 140  Recent Lipid Panel No results found for: CHOL, TRIG, HDL, CHOLHDL, VLDL, LDLCALC, LDLDIRECT   TTE 02/08/19: 1. Left ventricular ejection fraction, by visual estimation, is 60 to  65%. The left ventricle has normal function. There is no left ventricular  hypertrophy.  2. Global right ventricle has normal systolic function.The right  ventricular size is normal. No increase in right ventricular wall  thickness.  3. Left atrial size was normal.  4. Right atrial size was normal.  5. The mitral valve is normal in structure. No evidence of mitral valve  regurgitation.  6. The tricuspid valve is normal in structure. Tricuspid valve  regurgitation is not demonstrated.  7. The aortic valve is tricuspid. Aortic valve regurgitation is not  visualized. No evidence of aortic valve sclerosis or stenosis.  8. The pulmonic valve was normal in structure. Pulmonic valve  regurgitation is not visualized.  9. The inferior vena cava is normal in size with greater than 50%  respiratory variability, suggesting right atrial pressure of 3 mmHg.  10. The average left ventricular global longitudinal strain is -28.5 %.   Physical Exam:    VS:  There were no vitals taken for this visit.    Wt Readings from Last 3 Encounters:  01/23/19 154 lb 12.8 oz (70.2 kg)  01/08/19 150 lb (68 kg)  04/30/14 118 lb (53.5 kg)     GEN: Well nourished, well developed in no acute distress HEENT: Normal NECK: No JVD; No carotid bruits LYMPHATICS: No lymphadenopathy CARDIAC: Irregular, normal rate, no murmurs, rubs, gallops RESPIRATORY:  Clear to auscultation without  rales, wheezing or rhonchi  ABDOMEN: Soft, non-tender, non-distended MUSCULOSKELETAL:  No edema; No deformity  SKIN: Warm and dry NEUROLOGIC:  Alert and oriented x 3 PSYCHIATRIC:  Normal affect   ASSESSMENT:    No diagnosis found. PLAN:    In order of problems listed above:  PVCs: Symptomatic, reports palpitations.  TTE shows no structural heart disease -Given symptomatic, started metoprolol 25 mg twice daily -TTE to rule out structural heart disease -Zio patch x3 days to quantify PVC burden is pending -Encouraged to cut back on caffeine use  RTC in ***   Medication Adjustments/Labs and Tests Ordered: Current medicines are reviewed at length with the patient today.  Concerns regarding medicines are outlined above.  No orders of the  defined types were placed in this encounter.  No orders of the defined types were placed in this encounter.   There are no Patient Instructions on file for this visit.   Signed, Little Ishikawa, MD  04/23/2019 1:00 AM    [facility]

## 2019-05-27 ENCOUNTER — Encounter: Payer: Self-pay | Admitting: Cardiology

## 2020-03-05 ENCOUNTER — Other Ambulatory Visit: Payer: Self-pay

## 2020-08-30 IMAGING — CR DG CHEST 2V
2 series · 2 of 2 positions shown · non-contrast
Comparison: None.

CLINICAL DATA: Chest pain and palpitations for approximately 2
weeks. Shortness of breath on exertion

EXAM:
CHEST - 2 VIEW

[chest pa]
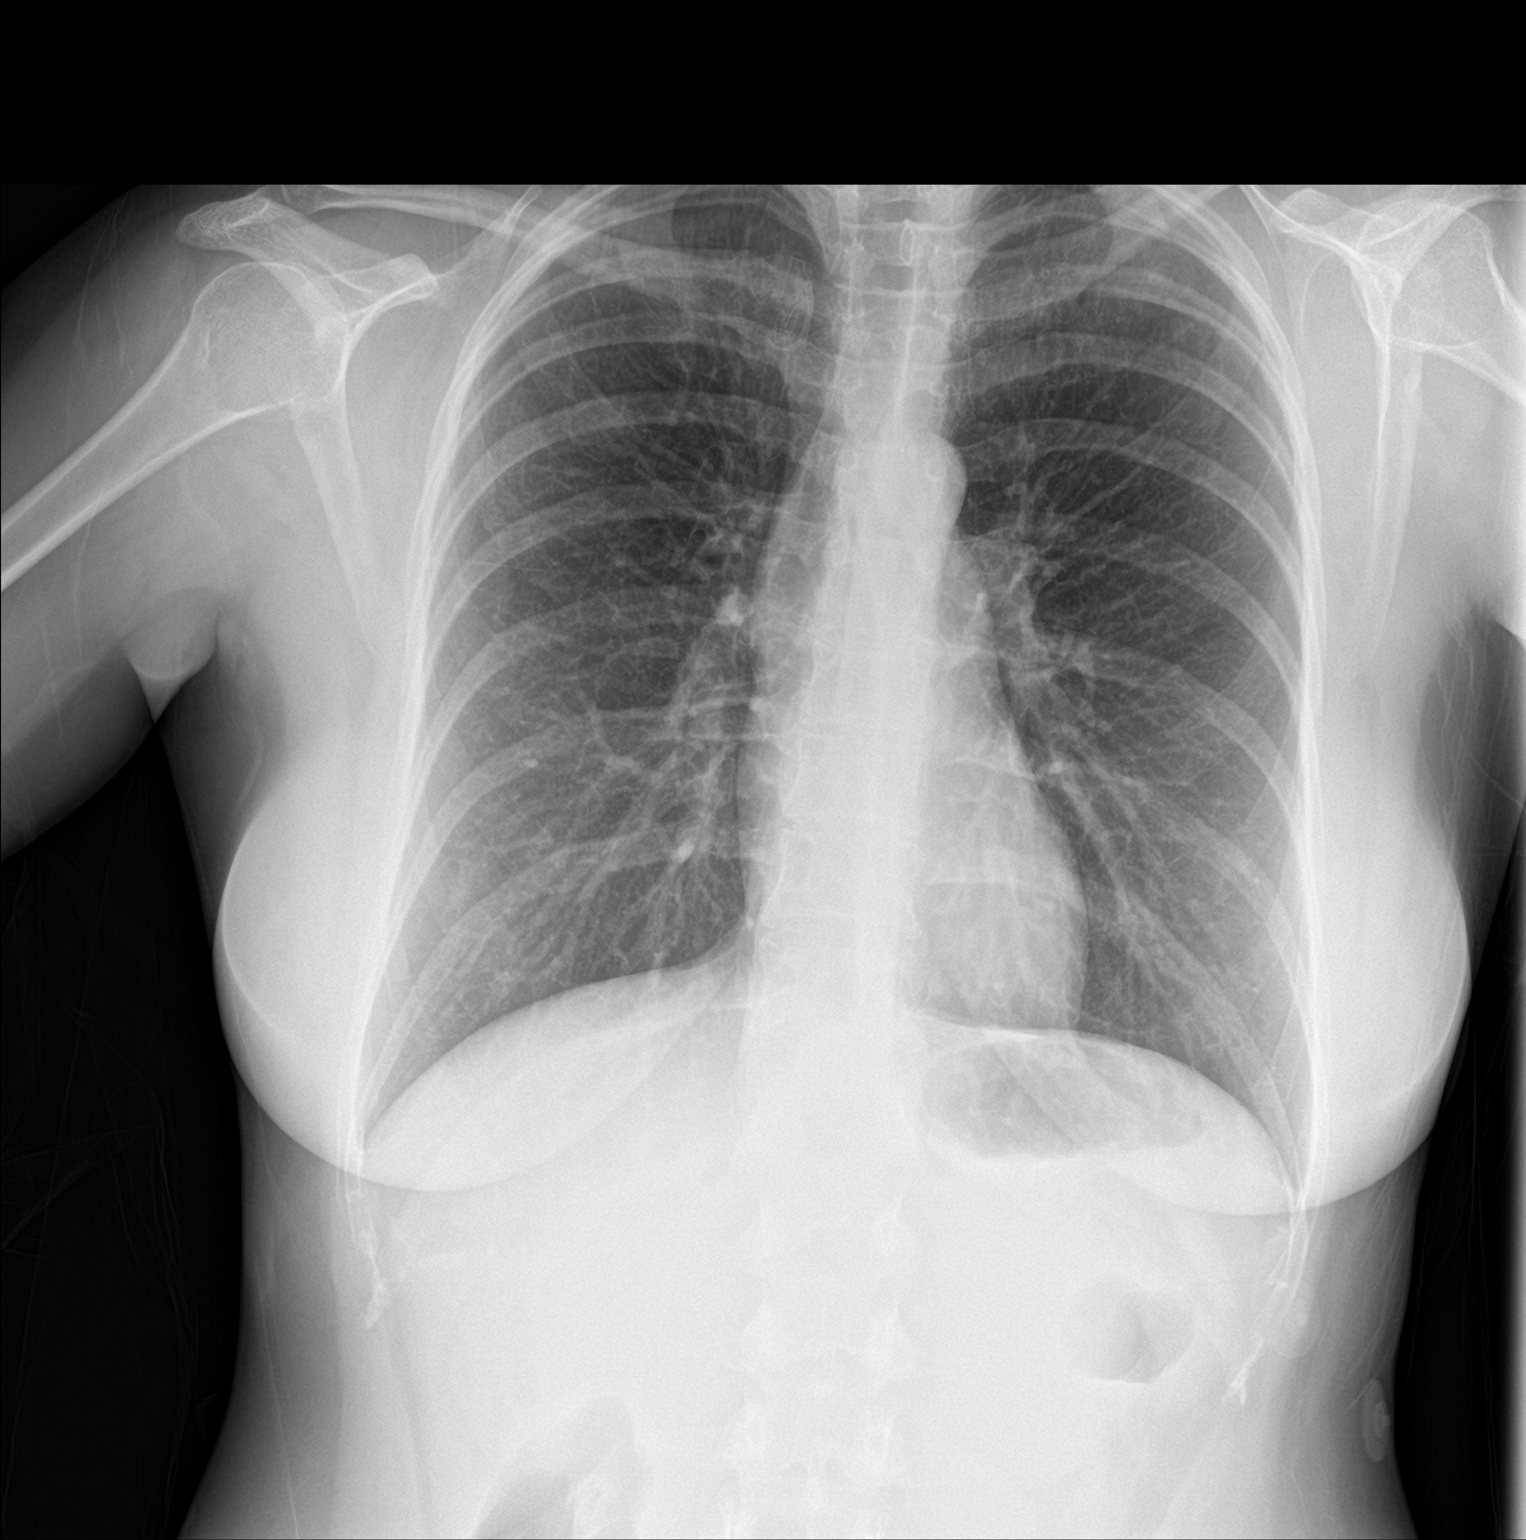

[chest lat]
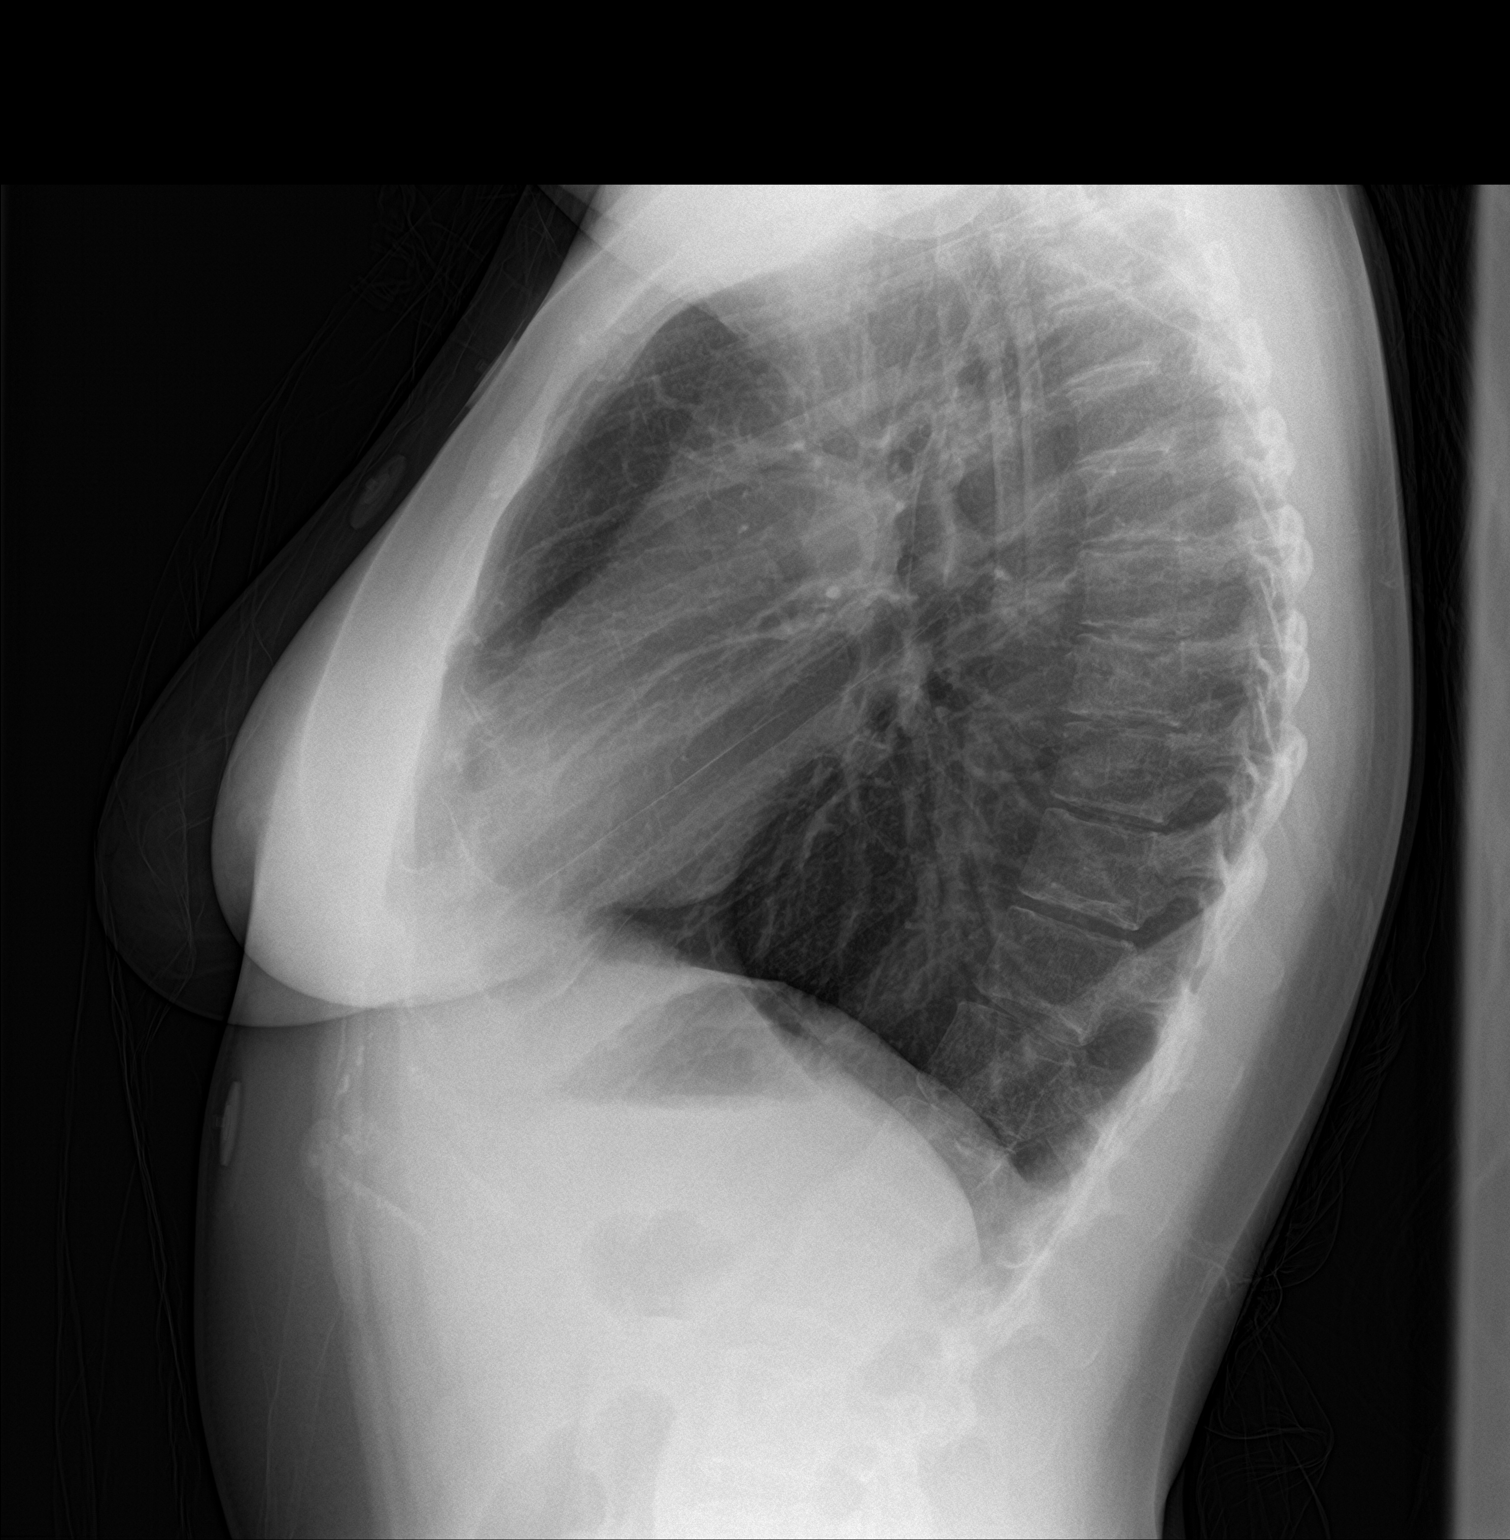

[2 of 2 positions shown; findings below may reference images not displayed]

FINDINGS: The heart size and mediastinal contours are within normal limits.
Both lungs are clear. The visualized skeletal structures are
unremarkable.
IMPRESSION: Normal study.

## 2020-10-23 ENCOUNTER — Other Ambulatory Visit (HOSPITAL_BASED_OUTPATIENT_CLINIC_OR_DEPARTMENT_OTHER): Payer: Self-pay

## 2020-10-23 ENCOUNTER — Emergency Department (HOSPITAL_BASED_OUTPATIENT_CLINIC_OR_DEPARTMENT_OTHER)
Admission: EM | Admit: 2020-10-23 | Discharge: 2020-10-23 | Disposition: A | Discharge status: Home / Self Care | Payer: Self-pay | Attending: Emergency Medicine | Admitting: Emergency Medicine

## 2020-10-23 ENCOUNTER — Emergency Department (HOSPITAL_BASED_OUTPATIENT_CLINIC_OR_DEPARTMENT_OTHER): Payer: Self-pay

## 2020-10-23 ENCOUNTER — Other Ambulatory Visit: Payer: Self-pay

## 2020-10-23 ENCOUNTER — Encounter (HOSPITAL_BASED_OUTPATIENT_CLINIC_OR_DEPARTMENT_OTHER): Payer: Self-pay | Admitting: Emergency Medicine

## 2020-10-23 DIAGNOSIS — R000 Tachycardia, unspecified: Secondary | ICD-10-CM | POA: Insufficient documentation

## 2020-10-23 DIAGNOSIS — Z87891 Personal history of nicotine dependence: Secondary | ICD-10-CM | POA: Insufficient documentation

## 2020-10-23 DIAGNOSIS — N10 Acute tubulo-interstitial nephritis: Secondary | ICD-10-CM

## 2020-10-23 DIAGNOSIS — N12 Tubulo-interstitial nephritis, not specified as acute or chronic: Secondary | ICD-10-CM | POA: Insufficient documentation

## 2020-10-23 DIAGNOSIS — R6510 Systemic inflammatory response syndrome (SIRS) of non-infectious origin without acute organ dysfunction: Secondary | ICD-10-CM | POA: Insufficient documentation

## 2020-10-23 DIAGNOSIS — U071 COVID-19: Secondary | ICD-10-CM | POA: Insufficient documentation

## 2020-10-23 LAB — COMPREHENSIVE METABOLIC PANEL
ALT: 14 U/L (ref 0–44)
AST: 20 U/L (ref 15–41)
Albumin: 3.7 g/dL (ref 3.5–5.0)
Alkaline Phosphatase: 42 U/L (ref 38–126)
Anion gap: 10 (ref 5–15)
BUN: 15 mg/dL (ref 6–20)
CO2: 20 mmol/L — ABNORMAL LOW (ref 22–32)
Calcium: 8.8 mg/dL — ABNORMAL LOW (ref 8.9–10.3)
Chloride: 100 mmol/L (ref 98–111)
Creatinine, Ser: 0.87 mg/dL (ref 0.44–1.00)
GFR, Estimated: >60 mL/min (ref >60)
Glucose, Bld: 153 mg/dL — ABNORMAL HIGH (ref 70–99)
Potassium: 3.2 mmol/L — ABNORMAL LOW (ref 3.5–5.1)
Sodium: 130 mmol/L — ABNORMAL LOW (ref 135–145)
Total Bilirubin: 0.2 mg/dL — ABNORMAL LOW (ref 0.3–1.2)
Total Protein: 7.1 g/dL (ref 6.5–8.1)

## 2020-10-23 LAB — PROTIME-INR
INR: 1.1 (ref 0.8–1.2)
Prothrombin Time: 14.6 seconds (ref 11.4–15.2)

## 2020-10-23 LAB — URINALYSIS, ROUTINE W REFLEX MICROSCOPIC
Bilirubin Urine: NEGATIVE
Glucose, UA: NEGATIVE mg/dL
Ketones, ur: NEGATIVE mg/dL
Nitrite: POSITIVE — AB
Protein, ur: NEGATIVE mg/dL
Specific Gravity, Urine: 1.005 (ref 1.005–1.030)
pH: 6.5 (ref 5.0–8.0)

## 2020-10-23 LAB — URINALYSIS, MICROSCOPIC (REFLEX)

## 2020-10-23 LAB — CBC WITH DIFFERENTIAL/PLATELET
Abs Immature Granulocytes: 0.08 10*3/uL — ABNORMAL HIGH (ref 0.00–0.07)
Basophils Absolute: 0 10*3/uL (ref 0.0–0.1)
Basophils Relative: 0 %
Eosinophils Absolute: 0 10*3/uL (ref 0.0–0.5)
Eosinophils Relative: 0 %
HCT: 35.4 % — ABNORMAL LOW (ref 36.0–46.0)
Hemoglobin: 12.7 g/dL (ref 12.0–15.0)
Immature Granulocytes: 1 %
Lymphocytes Relative: 4 %
Lymphs Abs: 0.7 10*3/uL (ref 0.7–4.0)
MCH: 33.7 pg (ref 26.0–34.0)
MCHC: 35.9 g/dL (ref 30.0–36.0)
MCV: 93.9 fL (ref 80.0–100.0)
Monocytes Absolute: 1.4 10*3/uL — ABNORMAL HIGH (ref 0.1–1.0)
Monocytes Relative: 9 %
Neutro Abs: 13.4 10*3/uL — ABNORMAL HIGH (ref 1.7–7.7)
Neutrophils Relative %: 86 %
Platelets: 162 10*3/uL (ref 150–400)
RBC: 3.77 MIL/uL — ABNORMAL LOW (ref 3.87–5.11)
RDW: 12.2 % (ref 11.5–15.5)
WBC: 15.5 10*3/uL — ABNORMAL HIGH (ref 4.0–10.5)
nRBC: 0 % (ref 0.0–0.2)

## 2020-10-23 LAB — RESP PANEL BY RT-PCR (FLU A&B, COVID) ARPGX2
Influenza A by PCR: NEGATIVE
Influenza B by PCR: NEGATIVE
SARS Coronavirus 2 by RT PCR: POSITIVE — AB

## 2020-10-23 LAB — LIPASE, BLOOD: Lipase: 36 U/L (ref 11–51)

## 2020-10-23 LAB — APTT: aPTT: 32 seconds (ref 24–36)

## 2020-10-23 LAB — PREGNANCY, URINE: Preg Test, Ur: NEGATIVE

## 2020-10-23 LAB — LACTIC ACID, PLASMA: Lactic Acid, Venous: 1 mmol/L (ref 0.5–1.9)

## 2020-10-23 MED ORDER — LACTATED RINGERS IV BOLUS (SEPSIS)
1000.0000 mL | Freq: Once | INTRAVENOUS | Status: AC
  Administered 2020-10-23: 1000 mL via INTRAVENOUS

## 2020-10-23 MED ORDER — CIPROFLOXACIN HCL 500 MG PO TABS: 500.0000 mg | ORAL_TABLET | Freq: Two times a day (BID) | ORAL | 0 refills | Status: DC

## 2020-10-23 MED ORDER — CIPROFLOXACIN IN D5W 400 MG/200ML IV SOLN
400.0000 mg | Freq: Once | INTRAVENOUS | Status: AC
  Administered 2020-10-23: 400 mg via INTRAVENOUS
  Filled 2020-10-23: qty 200

## 2020-10-23 MED ORDER — ACETAMINOPHEN 500 MG PO TABS
1000.0000 mg | ORAL_TABLET | Freq: Once | ORAL | Status: AC
  Administered 2020-10-23: 1000 mg via ORAL
  Filled 2020-10-23: qty 2

## 2020-10-23 MED ORDER — CIPROFLOXACIN HCL 500 MG PO TABS
500.0000 mg | ORAL_TABLET | Freq: Two times a day (BID) | ORAL | 0 refills | Status: AC
  Filled 2020-10-23: qty 14, 7d supply, fill #0

## 2020-10-23 NOTE — ED Notes (Signed)
Unable to get blood cultures with initial IV start. Attempted straight stick x1. No success. Other ED staff to look/obtain blood cultures. EDP aware.

## 2020-10-23 NOTE — ED Provider Notes (Signed)
[facility] EMERGENCY DEPARTMENT Provider Note   CSN: [id number] Arrival date & time: 10/23/20  0602     History Chief Complaint  Patient presents with   Abdominal Pain    Leslie Villegas is a 25 y.o. female.  She is here with a complaint of right-sided low back pain x2 days.  It is now moving towards her right flank and right lower chest along with 1 episode of vomiting.  Feeling hot and cold.  Decreased appetite.  No cough or shortness of breath.  No sore throat.  Does have moderate headache.  No urinary symptoms.  No vaginal bleeding or discharge.  No diarrhea.  No sick contacts or recent travel.  The history is provided by the patient.  Abdominal Pain Pain location:  RUQ and RLQ Pain quality: aching   Pain radiates to:  Chest Pain severity:  Moderate Onset quality:  Gradual Duration:  2 days Timing:  Constant Progression:  Worsening Chronicity:  New Context: not sick contacts and not trauma   Relieved by:  None tried Worsened by:  Nothing Ineffective treatments:  None tried Associated symptoms: chills, fever, nausea and vomiting   Associated symptoms: no chest pain, no cough, no diarrhea, no dysuria, no shortness of breath, no sore throat, no vaginal bleeding and no vaginal discharge       Past Medical History:  Diagnosis Date   Ankle fracture, right 04/21/2014   jumped from a window   Closed right pilon fracture 04/30/2014   Eating disorder    anorexia, per mother   Family history of adverse reaction to anesthesia    mother has hx. of post-op nausea, hypotension and hypothermia post-op   Irregular periods    Migraines    Poor appetite    Vegetarian diet     Patient Active Problem List   Diagnosis Date Noted   Alcohol abuse 11/29/2015   Major depressive disorder, recurrent episode, moderate (HCC) 11/29/2015   Anxiety 11/29/2015   Closed right pilon fracture 04/30/2014   ADHD (attention deficit hyperactivity disorder), combined type 01/11/2011    ODD (oppositional defiant disorder) 01/11/2011   EATING DISORDER, UNSPECIFIED 03/15/2010   Anorexia nervosa 06/04/2009    Past Surgical History:  Procedure Laterality Date   ORIF ANKLE FRACTURE Right 04/30/2014   Procedure: OPEN REDUCTION INTERNAL FIXATION (ORIF) RIGHT BIMALLEOLAR ANKLE FRACTURE;  Surgeon: Eulas Post, MD;  Location: [facility];  Service: Orthopedics;  Laterality: Right;   TONSILLECTOMY  04/14/2000     OB History   No obstetric history on file.     Family History  Problem Relation Age of Onset   Anesthesia problems Mother        post-op nausea/hypotension/hypothermia    Social History   Tobacco Use   Smoking status: Former    Years: 2.00    Types: Cigarettes   Smokeless tobacco: Never   Tobacco comments:    2-3 cig./day  Vaping Use   Vaping Use: Every day  Substance Use Topics   Alcohol use: Yes    Comment: occasionally   Drug use: No    Home Medications Prior to Admission medications   Medication Sig Start Date End Date Taking? Authorizing Provider  metoprolol succinate (TOPROL XL) 25 MG 24 hr tablet Take 1 tablet (25 mg total) by mouth 2 (two) times daily. 01/23/19   Little Ishikawa, MD    Allergies    Penicillins  Review of Systems   Review of Systems  Constitutional:  Positive for chills and fever.  HENT:  Negative for sore throat.   Eyes:  Negative for visual disturbance.  Respiratory:  Negative for cough and shortness of breath.   Cardiovascular:  Negative for chest pain.  Gastrointestinal:  Positive for abdominal pain, nausea and vomiting. Negative for diarrhea.  Genitourinary:  Negative for dysuria, vaginal bleeding and vaginal discharge.  Musculoskeletal:  Positive for back pain.  Skin:  Negative for rash.  Neurological:  Positive for headaches.   Physical Exam Updated Vital Signs BP 110/72 (BP Location: Left Arm)   Pulse (!) 125   Temp (!) 103.2 F (39.6 C) (Oral)   Resp 20   Ht 5\' 6"  (1.676 m)    Wt 63.5 kg   LMP 09/29/2020 (Exact Date)   SpO2 97%   BMI 22.60 kg/m   Physical Exam Vitals and nursing note reviewed.  Constitutional:      General: She is not in acute distress.    Appearance: Normal appearance. She is well-developed.  HENT:     Head: Normocephalic and atraumatic.  Eyes:     Conjunctiva/sclera: Conjunctivae normal.  Cardiovascular:     Rate and Rhythm: Regular rhythm. Tachycardia present.     Heart sounds: No murmur heard. Pulmonary:     Effort: Pulmonary effort is normal. No respiratory distress.     Breath sounds: Normal breath sounds.  Abdominal:     Palpations: Abdomen is soft.     Tenderness: There is no abdominal tenderness. There is no guarding or rebound.  Musculoskeletal:        General: No deformity or signs of injury. Normal range of motion.     Cervical back: Neck supple.  Skin:    General: Skin is warm and dry.  Neurological:     General: No focal deficit present.     Mental Status: She is alert.    ED Results / Procedures / Treatments   Labs (all labs ordered are listed, but only abnormal results are displayed) Labs Reviewed  RESP PANEL BY RT-PCR (FLU A&B, COVID) ARPGX2 - Abnormal; Notable for the following components:      Result Value   SARS Coronavirus 2 by RT PCR POSITIVE (*)    All other components within normal limits  CBC WITH DIFFERENTIAL/PLATELET - Abnormal; Notable for the following components:   WBC 15.5 (*)    RBC 3.77 (*)    HCT 35.4 (*)    Neutro Abs 13.4 (*)    Monocytes Absolute 1.4 (*)    Abs Immature Granulocytes 0.08 (*)    All other components within normal limits  COMPREHENSIVE METABOLIC PANEL - Abnormal; Notable for the following components:   Sodium 130 (*)    Potassium 3.2 (*)    CO2 20 (*)    Glucose, Bld 153 (*)    Calcium 8.8 (*)    Total Bilirubin 0.2 (*)    All other components within normal limits  URINALYSIS, ROUTINE W REFLEX MICROSCOPIC - Abnormal; Notable for the following components:    APPearance HAZY (*)    Hgb urine dipstick SMALL (*)    Nitrite POSITIVE (*)    Leukocytes,Ua MODERATE (*)    All other components within normal limits  URINALYSIS, MICROSCOPIC (REFLEX) - Abnormal; Notable for the following components:   Bacteria, UA MANY (*)    All other components within normal limits  URINE CULTURE  CULTURE, BLOOD (ROUTINE X 2)  CULTURE, BLOOD (ROUTINE X 2)  LIPASE, BLOOD  LACTIC ACID, PLASMA  PROTIME-INR  APTT  PREGNANCY, URINE    EKG None  Radiology DG Chest Port 1 View  Result Date: 10/23/2020 CLINICAL DATA:  Possible sepsis. EXAM: PORTABLE CHEST 1 VIEW COMPARISON:  Chest x-ray dated January 08, 2019. FINDINGS: The heart size and mediastinal contours are within normal limits. Both lungs are clear. The visualized skeletal structures are unremarkable. IMPRESSION: No active disease. Electronically Signed   By: Obie DredgeWilliam T Derry M.D.   On: 10/23/2020 08:14    Procedures .Critical Care  Date/Time: 10/23/2020 6:02 PM Performed by: Terrilee FilesButler, Tymika Grilli C, MD Authorized by: Terrilee FilesButler, Jaxn Chiquito C, MD   Critical care provider statement:    Critical care time (minutes):  45   Critical care time was exclusive of:  Separately billable procedures and treating other patients   Critical care was necessary to treat or prevent imminent or life-threatening deterioration of the following conditions: sirs.   Critical care was time spent personally by me on the following activities:  Discussions with consultants, evaluation of patient's response to treatment, examination of patient, ordering and performing treatments and interventions, ordering and review of laboratory studies, ordering and review of radiographic studies, pulse oximetry, re-evaluation of patient's condition, obtaining history from patient or surrogate, review of old charts and development of treatment plan with patient or surrogate   Medications Ordered in ED Medications  acetaminophen (TYLENOL) tablet 1,000 mg (1,000 mg  Oral Given 10/23/20 0653)  lactated ringers bolus 1,000 mL (0 mLs Intravenous Stopped 10/23/20 0946)  ciprofloxacin (CIPRO) IVPB 400 mg (0 mg Intravenous Stopped 10/23/20 1155)    ED Course  I have reviewed the triage vital signs and the nursing notes.  Pertinent labs & imaging results that were available during my care of the patient were reviewed by me and considered in my medical decision making (see chart for details).  Clinical Course as of 10/23/20 1758  Fri Oct 23, 2020  [id number] Chest x-ray interpreted by me as no acute infiltrates.  Awaiting radiology reading. [MB]  1048 Reviewed results of testing with patient.  She is getting a dose of IV antibiotics now.  Her heart rate has come down and she is defervesced.  She likely will be able to be discharged on oral antibiotics. [MB]  1153 Patient still has a headache but otherwise is doing well.  Afebrile now and tachycardia has resolved. [MB]    Clinical Course User Index [MB] Terrilee FilesButler, Mckynlie Vanderslice C, MD   MDM Rules/Calculators/A&P                          Margret ChanceSuzanne Netzley was evaluated in Emergency Department on 10/23/2020 for the symptoms described in the history of present illness. She was evaluated in the context of the global COVID-19 pandemic, which necessitated consideration that the patient might be at risk for infection with the SARS-CoV-2 virus that causes COVID-19. Institutional protocols and algorithms that pertain to the evaluation of patients at risk for COVID-19 are in a state of rapid change based on information released by regulatory bodies including the CDC and federal and state organizations. These policies and algorithms were followed during the patient's care in the ED.  This patient complains of back pain abdominal pain nausea vomiting fever; this involves an extensive number of treatment Options and is a complaint that carries with it a high risk of complications and Morbidity. The differential includes sepsis, Sirs,  pyelonephritis, renal colic, COVID, flu  I ordered, reviewed and interpreted labs, which included CBC with  elevated white count, hemoglobin slightly lower than priors, chemistries with low sodium potassium and chloride possibly reflecting some dehydration, normal LFTs, COVID testing positive, urinalysis consistent with infection nitrite +20 1-50 white blood cells, lactate not elevated.  Urine sent for culture along with blood cultures I ordered medication IV fluids IV antibiotics and nausea medication, pain medicine I ordered imaging studies which included chest x-ray and I independently    visualized and interpreted imaging which showed no acute infiltrates  Previous records obtained and reviewed in epic no recent admissions   Critical Interventions: Work-up and treatment of patient's SIRS criteria with aggressive IV fluids and antibiotics.  After the interventions stated above, I reevaluated the patient and found patient's heart rate and fever to be improved.  She is tolerating p.o.  She is comfortable plan for oral antibiotics and isolation for COVID symptoms.  Return instructions discussed.   Final Clinical Impression(s) / ED Diagnoses Final diagnoses:  Acute pyelonephritis  COVID-19 virus infection  SIRS (systemic inflammatory response syndrome) (HCC)    Rx / DC Orders ED Discharge Orders          Ordered    ciprofloxacin (CIPRO) 500 MG tablet  2 times daily,   Status:  Discontinued        10/23/20 1109    ciprofloxacin (CIPRO) 500 MG tablet  2 times daily        10/23/20 1201             Terrilee Files, MD 10/23/20 [phone]

## 2020-10-23 NOTE — ED Triage Notes (Signed)
Patient arrived via POV c/o right flank pain x 2 days with pain moving from back to front. Patient states 1 episode of emesis approximately 30 min pta. Patient is AO x 4, VS w/ elevated HR and Temp, normal gait.

## 2020-10-23 NOTE — Discharge Instructions (Addendum)
You are seen in the emergency department for evaluation of fever chills back pain.  You tested positive for COVID.  You also have a urinary tract infection that is likely involving the kidneys.  We are putting you on antibiotics.  Please finish them.  Drink plenty of fluids and use Tylenol and ibuprofen as needed for fever and pain.  Follow-up with your regular primary care doctor.  For the COVID you will need to isolate for at least 5 days from the beginning of your symptoms.  Return to the emergency department if any worsening or concerning symptoms

## 2020-10-25 LAB — URINE CULTURE: Culture: >=100000 — AB

## 2020-10-26 ENCOUNTER — Telehealth: Payer: Self-pay

## 2020-10-26 NOTE — Telephone Encounter (Signed)
Post ED Visit - Positive Culture Follow-up  Culture report reviewed by antimicrobial stewardship pharmacist: [facility] [x]  , Pharm.D. []  Anson Crofts, Pharm.D., BCPS AQ-ID []  , Pharm.D., BCPS []  Celedonio Miyamoto, Pharm.D., BCPS []  Morris, Garvin Fila.D., BCPS, AAHIVP []  , Pharm.D., BCPS, AAHIVP []  Georgina Pillion, PharmD, BCPS []  , PharmD, BCPS []  [place], PharmD, BCPS []  [address], PharmD []  , PharmD, BCPS []  Estella Husk, PharmD  Pharmacy Team []  Lysle Pearl, PharmD []  , PharmD []  Phillips Climes, PharmD []  , Rph []  Agapito Games) , PharmD []  Verlan Friends, PharmD []  , PharmD []  Mervyn Gay, PharmD []  , PharmD []  Vinnie Level, PharmD []  [facility], PharmD []  , PharmD []  Len Childs, PharmD   Positive urine culture Treated with Ciprofloxacin, organism sensitive to the same and no further patient follow-up is required at this time.  10/26/2020, 10:32 AM

## 2020-10-28 LAB — CULTURE, BLOOD (ROUTINE X 2)
Culture: NO GROWTH
Culture: NO GROWTH
Special Requests: ADEQUATE
Special Requests: ADEQUATE

## 2023-02-21 DIAGNOSIS — F10929 Alcohol use, unspecified with intoxication, unspecified: Secondary | ICD-10-CM | POA: Diagnosis not present

## 2023-02-21 DIAGNOSIS — F10220 Alcohol dependence with intoxication, uncomplicated: Secondary | ICD-10-CM | POA: Diagnosis not present

## 2023-02-21 DIAGNOSIS — F10120 Alcohol abuse with intoxication, uncomplicated: Secondary | ICD-10-CM | POA: Diagnosis not present

## 2023-11-15 ENCOUNTER — Encounter (HOSPITAL_BASED_OUTPATIENT_CLINIC_OR_DEPARTMENT_OTHER): Payer: Self-pay

## 2023-11-15 ENCOUNTER — Other Ambulatory Visit: Payer: Self-pay

## 2023-11-15 ENCOUNTER — Emergency Department (HOSPITAL_BASED_OUTPATIENT_CLINIC_OR_DEPARTMENT_OTHER)
Admission: EM | Admit: 2023-11-15 | Discharge: 2023-11-15 | Disposition: A | Discharge status: Home / Self Care | Attending: Emergency Medicine | Admitting: Emergency Medicine

## 2023-11-15 DIAGNOSIS — R102 Pelvic and perineal pain: Secondary | ICD-10-CM | POA: Diagnosis present

## 2023-11-15 DIAGNOSIS — M545 Low back pain, unspecified: Secondary | ICD-10-CM | POA: Insufficient documentation

## 2023-11-15 LAB — COMPREHENSIVE METABOLIC PANEL WITH GFR
ALT: 23 U/L (ref 0–44)
AST: 24 U/L (ref 15–41)
Albumin: 4.8 g/dL (ref 3.5–5.0)
Alkaline Phosphatase: 47 U/L (ref 38–126)
Anion gap: 15 (ref 5–15)
BUN: 8 mg/dL (ref 6–20)
CO2: 24 mmol/L (ref 22–32)
Calcium: 10.6 mg/dL — ABNORMAL HIGH (ref 8.9–10.3)
Chloride: 101 mmol/L (ref 98–111)
Creatinine, Ser: 0.65 mg/dL (ref 0.44–1.00)
GFR, Estimated: >60 mL/min (ref >60)
Glucose, Bld: 86 mg/dL (ref 70–99)
Potassium: 3.8 mmol/L (ref 3.5–5.1)
Sodium: 139 mmol/L (ref 135–145)
Total Bilirubin: 0.4 mg/dL (ref 0.0–1.2)
Total Protein: 7.6 g/dL (ref 6.5–8.1)

## 2023-11-15 LAB — CBC
HCT: 38.5 % (ref 36.0–46.0)
Hemoglobin: 13.3 g/dL (ref 12.0–15.0)
MCH: 33.3 pg (ref 26.0–34.0)
MCHC: 34.5 g/dL (ref 30.0–36.0)
MCV: 96.5 fL (ref 80.0–100.0)
Platelets: 280 K/uL (ref 150–400)
RBC: 3.99 MIL/uL (ref 3.87–5.11)
RDW: 12.7 % (ref 11.5–15.5)
WBC: 7.7 K/uL (ref 4.0–10.5)
nRBC: 0 % (ref 0.0–0.2)

## 2023-11-15 LAB — WET PREP, GENITAL
Clue Cells Wet Prep HPF POC: NONE SEEN
Sperm: NONE SEEN
Trich, Wet Prep: NONE SEEN
WBC, Wet Prep HPF POC: <10 (ref <10)
Yeast Wet Prep HPF POC: NONE SEEN

## 2023-11-15 LAB — URINALYSIS, ROUTINE W REFLEX MICROSCOPIC
Bilirubin Urine: NEGATIVE
Glucose, UA: NEGATIVE mg/dL
Hgb urine dipstick: NEGATIVE
Ketones, ur: NEGATIVE mg/dL
Leukocytes,Ua: NEGATIVE
Nitrite: NEGATIVE
Protein, ur: NEGATIVE mg/dL
Specific Gravity, Urine: 1.01 (ref 1.005–1.030)
pH: 7 (ref 5.0–8.0)

## 2023-11-15 LAB — LIPASE, BLOOD: Lipase: 35 U/L (ref 11–51)

## 2023-11-15 LAB — MAGNESIUM: Magnesium: 2 mg/dL (ref 1.7–2.4)

## 2023-11-15 LAB — PREGNANCY, URINE: Preg Test, Ur: NEGATIVE

## 2023-11-15 MED ORDER — LACTATED RINGERS IV BOLUS: 1000.0000 mL | Freq: Once | INTRAVENOUS | Status: DC

## 2023-11-15 NOTE — Discharge Instructions (Addendum)
 Make an appointment to follow-up with an OB/GYN or your primary care doctor.  Return to the emergency room if you have any worsening symptoms.

## 2023-11-15 NOTE — ED Provider Notes (Signed)
 [place] [facility] Provider Note   CSN: [id number] Arrival date & time: 11/15/23  8177     Patient presents with: Abdominal Pain and Flank Pain   Leslie Villegas is a 28 y.o. female.   Patient is a 28 year old who presents with pelvic pain.  She said it started about 3 days ago.  Said crampy pain in her lower abdomen.  She also has some lower back pain.  She denies any fevers.  No urinary symptoms.  She is concerned that may be a kidney stone.  She denies any nausea or vomiting.  No change in her stools.  No vaginal bleeding or discharge.  She recently finished her menstrual cycle.  She has not had any recent sexual activity in the last 2 or 3 months.       Prior to Admission medications   Medication Sig Start Date End Date Taking? Authorizing Provider  ciprofloxacin  (CIPRO ) 500 MG tablet Take 1 tablet (500 mg total) by mouth 2 (two) times daily. 10/23/20   Towana Ozell BROCKS, MD  metoprolol  succinate (TOPROL  XL) 25 MG 24 hr tablet Take 1 tablet (25 mg total) by mouth 2 (two) times daily. 01/23/19   Kate Lonni CROME, MD    Allergies: Penicillins    Review of Systems  Constitutional:  Negative for chills, diaphoresis, fatigue and fever.  HENT:  Negative for congestion, rhinorrhea and sneezing.   Eyes: Negative.   Respiratory:  Negative for cough, chest tightness and shortness of breath.   Cardiovascular:  Negative for chest pain and leg swelling.  Gastrointestinal:  Positive for abdominal pain. Negative for diarrhea, nausea and vomiting.  Genitourinary:  Negative for difficulty urinating, flank pain, frequency, hematuria, vaginal bleeding and vaginal discharge.  Musculoskeletal:  Negative for arthralgias and back pain.  Skin:  Negative for rash.  Neurological:  Negative for dizziness, speech difficulty, weakness, numbness and headaches.    Updated Vital Signs BP (!) 138/103 (BP Location: Left Arm)   Pulse 66   Temp 98.7 F (37.1  C) (Oral)   Resp 18   LMP 11/08/2023 (Approximate)   SpO2 98%   Physical Exam Constitutional:      Appearance: She is well-developed.  HENT:     Head: Normocephalic and atraumatic.  Eyes:     Pupils: Pupils are equal, round, and reactive to light.  Cardiovascular:     Rate and Rhythm: Normal rate and regular rhythm.     Heart sounds: Normal heart sounds.  Pulmonary:     Effort: Pulmonary effort is normal. No respiratory distress.     Breath sounds: Normal breath sounds. No wheezing or rales.  Chest:     Chest wall: No tenderness.  Abdominal:     General: Bowel sounds are normal.     Palpations: Abdomen is soft.     Tenderness: There is abdominal tenderness (Very mild suprapubic tenderness). There is no guarding or rebound.  Genitourinary:    Comments: With chaperone present, pelvic exam was performed, no cervical motion tenderness, no adnexal tenderness, no significant discharge Musculoskeletal:        General: Normal range of motion.     Cervical back: Normal range of motion and neck supple.  Lymphadenopathy:     Cervical: No cervical adenopathy.  Skin:    General: Skin is warm and dry.     Findings: No rash.  Neurological:     Mental Status: She is alert and oriented to person, place, and time.     (  all labs ordered are listed, but only abnormal results are displayed) Labs Reviewed  COMPREHENSIVE METABOLIC PANEL WITH GFR - Abnormal; Notable for the following components:      Result Value   Calcium 10.6 (*)    All other components within normal limits  WET PREP, GENITAL  LIPASE, BLOOD  CBC  URINALYSIS, ROUTINE W REFLEX MICROSCOPIC  PREGNANCY, URINE  MAGNESIUM  RPR  HIV ANTIBODY (ROUTINE TESTING W REFLEX)  GC/CHLAMYDIA PROBE AMP ([facility]) NOT AT [facility]    EKG: None  Radiology: No results found.   Procedures   Medications Ordered in the ED - No data to display                                  Medical Decision Making Amount and/or Complexity of  Data Reviewed Labs: ordered.   This patient presents to the ED for concern of pelvic pain, this involves an extensive number of treatment options, and is a complaint that carries with it a high risk of complications and morbidity.  I considered the following differential and admission for this acute, potentially life threatening condition.  The differential diagnosis includes UTI, kidney stone, cervicitis, PID, ovarian torsion, ectopic pregnancy, appendicitis, colitis  MDM:    Patient is a 28 year old who presents with lower abdominal pain.  On exam, she has minimal tenderness.  There is no pain over the appendix or the gallbladder.  Pelvic exam was performed which shows no adnexal tenderness.  No significant discharge.  No clinical concerns for cervicitis or ovarian torsion.  Her pregnancy test is negative.  Wet prep was negative.  Labs reviewed and are nonconcerning.  Urine is not consistent with infection.  No hematuria that would be more concerning for renal colic.  She is minimally symptomatic currently.  I do not feel that further imaging is indicated currently.  She was discharged home in good condition.  She was encouraged to follow-up with an OB/GYN.  Return precautions were given.  (Labs, imaging, consults)  Labs: I Ordered, and personally interpreted labs.  The pertinent results include: Pregnancy negative, white count normal, urine normal  Imaging Studies ordered: I ordered imaging studies including   I independently visualized and interpreted imaging. I agree with the radiologist interpretation  Additional history obtained from  .  External records from outside source obtained and reviewed including    Cardiac Monitoring: The patient was not maintained on a cardiac monitor.  If on the cardiac monitor, I personally viewed and interpreted the cardiac monitored which showed an underlying rhythm of:    Reevaluation: After the interventions noted above, I reevaluated the patient and  found that they have :stayed the same  Social Determinants of Health:    Disposition: Discharged to home  Co morbidities that complicate the patient evaluation  Past Medical History:  Diagnosis Date   Ankle fracture, right 04/21/2014   jumped from a window   Closed right pilon fracture 04/30/2014   Eating disorder    anorexia, per mother   Family history of adverse reaction to anesthesia    mother has hx. of post-op nausea, hypotension and hypothermia post-op   Irregular periods    Migraines    Poor appetite    Vegetarian diet      Medicines Meds ordered this encounter  Medications   DISCONTD: lactated ringers  bolus 1,000 mL    I have reviewed the patients home medicines and have  made adjustments as needed  Problem List / ED Course: Problem List Items Addressed This Visit   None Visit Diagnoses       Pelvic pain    -  Primary                Final diagnoses:  Pelvic pain    ED Discharge Orders     None          Lenor Hollering, MD 11/15/23 2313

## 2023-11-15 NOTE — ED Triage Notes (Signed)
 BIL flank pain, BIL abd pain for 3 days. Denies NVD, constipation, dysuria, hematuria. Reports increased urinary frequency

## 2023-11-16 LAB — RPR: RPR Ser Ql: NONREACTIVE

## 2023-11-16 LAB — HIV ANTIBODY (ROUTINE TESTING W REFLEX): HIV Screen 4th Generation wRfx: NONREACTIVE

## 2023-11-17 LAB — GC/CHLAMYDIA PROBE AMP (~~LOC~~) NOT AT ARMC
Chlamydia: NEGATIVE
Comment: NEGATIVE
Comment: NORMAL
Neisseria Gonorrhea: NEGATIVE
